# Patient Record
Sex: Male | Born: 1975 | Hispanic: Yes | Marital: Married | Smoking: Former smoker
Health system: Southern US, Community
[De-identification: ages and names within clinical notes are randomized; demographics above are authoritative.]

## PROBLEM LIST (undated history)

## (undated) DIAGNOSIS — I272 Pulmonary hypertension, unspecified: Secondary | ICD-10-CM

## (undated) DIAGNOSIS — E119 Type 2 diabetes mellitus without complications: Secondary | ICD-10-CM

## (undated) DIAGNOSIS — Z86718 Personal history of other venous thrombosis and embolism: Secondary | ICD-10-CM

## (undated) DIAGNOSIS — Z86711 Personal history of pulmonary embolism: Secondary | ICD-10-CM

## (undated) DIAGNOSIS — F4310 Post-traumatic stress disorder, unspecified: Secondary | ICD-10-CM

## (undated) DIAGNOSIS — F419 Anxiety disorder, unspecified: Secondary | ICD-10-CM

## (undated) DIAGNOSIS — R112 Nausea with vomiting, unspecified: Secondary | ICD-10-CM

## (undated) DIAGNOSIS — Z8659 Personal history of other mental and behavioral disorders: Secondary | ICD-10-CM

## (undated) DIAGNOSIS — I2699 Other pulmonary embolism without acute cor pulmonale: Secondary | ICD-10-CM

## (undated) DIAGNOSIS — G4733 Obstructive sleep apnea (adult) (pediatric): Secondary | ICD-10-CM

## (undated) DIAGNOSIS — G8929 Other chronic pain: Secondary | ICD-10-CM

## (undated) DIAGNOSIS — Z8674 Personal history of sudden cardiac arrest: Secondary | ICD-10-CM

## (undated) DIAGNOSIS — S43432A Superior glenoid labrum lesion of left shoulder, initial encounter: Secondary | ICD-10-CM

## (undated) DIAGNOSIS — M25512 Pain in left shoulder: Secondary | ICD-10-CM

## (undated) DIAGNOSIS — Z95810 Presence of automatic (implantable) cardiac defibrillator: Secondary | ICD-10-CM

## (undated) DIAGNOSIS — I10 Essential (primary) hypertension: Secondary | ICD-10-CM

## (undated) DIAGNOSIS — Z9889 Other specified postprocedural states: Secondary | ICD-10-CM

## (undated) DIAGNOSIS — G4730 Sleep apnea, unspecified: Secondary | ICD-10-CM

## (undated) DIAGNOSIS — F411 Generalized anxiety disorder: Secondary | ICD-10-CM

## (undated) HISTORY — DX: Other pulmonary embolism without acute cor pulmonale: I26.99

## (undated) HISTORY — DX: Essential (primary) hypertension: I10

## (undated) HISTORY — PX: EP IMPLANTABLE DEVICE: SHX172B

## (undated) HISTORY — DX: Pulmonary hypertension, unspecified: I27.20

## (undated) HISTORY — PX: OTHER SURGICAL HISTORY: SHX169

## (undated) HISTORY — PX: KNEE SURGERY: SHX244

## (undated) HISTORY — PX: CARDIAC CATHETERIZATION: SHX172

## (undated) HISTORY — PX: ANTERIOR CRUCIATE LIGAMENT REPAIR: SHX115

---
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
## 2013-02-20 DIAGNOSIS — E119 Type 2 diabetes mellitus without complications: Secondary | ICD-10-CM | POA: Insufficient documentation

## 2013-02-20 DIAGNOSIS — E118 Type 2 diabetes mellitus with unspecified complications: Secondary | ICD-10-CM | POA: Insufficient documentation

## 2013-02-20 DIAGNOSIS — I10 Essential (primary) hypertension: Secondary | ICD-10-CM | POA: Insufficient documentation

## 2014-07-11 DIAGNOSIS — S8990XA Unspecified injury of unspecified lower leg, initial encounter: Secondary | ICD-10-CM | POA: Insufficient documentation

## 2014-07-14 DIAGNOSIS — G8918 Other acute postprocedural pain: Secondary | ICD-10-CM | POA: Insufficient documentation

## 2014-07-14 HISTORY — PX: OTHER SURGICAL HISTORY: SHX169

## 2014-08-02 DIAGNOSIS — E1169 Type 2 diabetes mellitus with other specified complication: Secondary | ICD-10-CM | POA: Insufficient documentation

## 2014-08-02 DIAGNOSIS — I2699 Other pulmonary embolism without acute cor pulmonale: Secondary | ICD-10-CM | POA: Insufficient documentation

## 2014-08-02 DIAGNOSIS — F17210 Nicotine dependence, cigarettes, uncomplicated: Secondary | ICD-10-CM | POA: Insufficient documentation

## 2014-08-02 DIAGNOSIS — E782 Mixed hyperlipidemia: Secondary | ICD-10-CM | POA: Insufficient documentation

## 2014-08-04 DIAGNOSIS — R748 Abnormal levels of other serum enzymes: Secondary | ICD-10-CM | POA: Insufficient documentation

## 2014-08-27 DIAGNOSIS — I272 Pulmonary hypertension, unspecified: Secondary | ICD-10-CM | POA: Insufficient documentation

## 2015-09-14 ENCOUNTER — Emergency Department (INDEPENDENT_AMBULATORY_CARE_PROVIDER_SITE_OTHER): Payer: BLUE CROSS/BLUE SHIELD

## 2015-09-14 ENCOUNTER — Emergency Department
Admission: EM | Admit: 2015-09-14 | Discharge: 2015-09-14 | Disposition: A | Discharge status: Home / Self Care | Payer: BLUE CROSS/BLUE SHIELD | Source: Home / Self Care | Attending: Family Medicine | Admitting: Family Medicine

## 2015-09-14 ENCOUNTER — Encounter: Payer: Self-pay | Admitting: *Deleted

## 2015-09-14 DIAGNOSIS — R42 Dizziness and giddiness: Secondary | ICD-10-CM

## 2015-09-14 DIAGNOSIS — I10 Essential (primary) hypertension: Secondary | ICD-10-CM

## 2015-09-14 DIAGNOSIS — D72829 Elevated white blood cell count, unspecified: Secondary | ICD-10-CM | POA: Diagnosis not present

## 2015-09-14 DIAGNOSIS — M25562 Pain in left knee: Secondary | ICD-10-CM

## 2015-09-14 DIAGNOSIS — E1365 Other specified diabetes mellitus with hyperglycemia: Secondary | ICD-10-CM

## 2015-09-14 DIAGNOSIS — IMO0002 Reserved for concepts with insufficient information to code with codable children: Secondary | ICD-10-CM

## 2015-09-14 DIAGNOSIS — Z86711 Personal history of pulmonary embolism: Secondary | ICD-10-CM

## 2015-09-14 DIAGNOSIS — G8929 Other chronic pain: Secondary | ICD-10-CM

## 2015-09-14 DIAGNOSIS — E1343 Other specified diabetes mellitus with diabetic autonomic (poly)neuropathy: Secondary | ICD-10-CM | POA: Diagnosis not present

## 2015-09-14 HISTORY — DX: Type 2 diabetes mellitus without complications: E11.9

## 2015-09-14 LAB — POCT URINALYSIS DIPSTICK
Blood, UA: NEGATIVE
GLUCOSE UA: 500
LEUKOCYTES UA: NEGATIVE
NITRITE UA: NEGATIVE
Protein, UA: 30
Spec Grav, UA: >=1.03 (ref 1.005–1.03)
UROBILINOGEN UA: 0.2 (ref 0–1)
pH, UA: 5 (ref 5–8)

## 2015-09-14 LAB — POCT CBC W AUTO DIFF (K'VILLE URGENT CARE)

## 2015-09-14 LAB — SEDIMENTATION RATE: SED RATE: 5 mm/h (ref 0–15)

## 2015-09-14 LAB — POCT FASTING CBG KUC MANUAL ENTRY: POCT Glucose (KUC): 305 mg/dL — AB (ref 70–99)

## 2015-09-14 LAB — D-DIMER, QUANTITATIVE: D-Dimer, Quant: 0.19 mcg/mL FEU (ref <0.50)

## 2015-09-14 MED ORDER — LISINOPRIL 5 MG PO TABS: 5.0000 mg | ORAL_TABLET | Freq: Every day | ORAL | 0 refills | Status: DC

## 2015-09-14 NOTE — Discharge Instructions (Signed)
Continue Metformin as prescribed.  Recommend checking blood pressure regularly and record on a calendar.  Also check blood glucose several times weekly and record

## 2015-09-14 NOTE — ED Provider Notes (Signed)
[facility]    CSN: [id number] Arrival date & time: 09/14/15  1115  First Provider Contact:  None       History   Chief Complaint Chief Complaint  Patient presents with  . Dizziness    HPI Russell Ramos is a 40 y.o. male.   Patient complains of approximately 3 month history of initially intermittent vague sensation of light-headedness/dizziness that would last 15 to 30 seconds.  He does not feel off balance or have difficulty walking.  Recently the symptoms have become more frequent, and during the past several days have been almost constant. He states that he had repair of a damaged left knee LCL in May 2016, suffering a post-op pulmonary embolus.  He recalls that at that time he had similar symptoms with the addition of severe chest tightness and dyspnea.  At this time he has not experienced any chest symptoms.  No lower leg swelling or pain.  He states that he still has chronic discomfort in his left lateral knee, although improved since his knee surgery. He has a history of hypertension but admits that he has not taken his lisinopril for about a month.  He also has a history of type 2 diabetes that was originally controlled on metformin 1000mg  BID.  He admits that he does not monitor his glucose, and has gained weight over the past year.  His last Hgb A1c was 9+ (he does not remember the date).  He states that he does not like metformin because it causes loose stools.   The history is provided by the patient and the spouse.    Past Medical History:  Diagnosis Date  . Diabetes mellitus without complication (HCC)   Pulmonary embolus  Current active problems:  Hypertension and diabetes   Past Surgical History:  Procedure Laterality Date  . KNEE SURGERY Left        Home Medications    Prior to Admission medications   Medication Sig Start Date End Date Taking? Authorizing Provider  atorvastatin (LIPITOR) 40 MG tablet Take 40 mg by mouth daily.   Yes  Historical Provider, MD  metFORMIN (GLUCOPHAGE) 500 MG tablet Take by mouth 2 (two) times daily with a meal.   Yes Historical Provider, MD  lisinopril (PRINIVIL,ZESTRIL) 5 MG tablet Take 1 tablet (5 mg total) by mouth daily. 09/14/15   Lattie Haw, MD    Family History Family History  Problem Relation Age of Onset  . Diabetes Mother     Social History Social History  Substance Use Topics  . Smoking status: Current Every Day Smoker    Packs/day: 1.00    Types: Cigarettes  . Smokeless tobacco: Never Used  . Alcohol use Yes     Comment: socially     Allergies   Review of patient's allergies indicates no known allergies.   Review of Systems Review of Systems  Constitutional: Positive for activity change and fatigue. Negative for appetite change, chills, diaphoresis, fever and unexpected weight change.  HENT: Negative.   Eyes: Negative.   Respiratory: Negative.   Cardiovascular: Negative for chest pain, palpitations and leg swelling.  Gastrointestinal: Negative.   Endocrine: Positive for polyuria.  Genitourinary: Negative.   Musculoskeletal:       Left knee pain  Skin: Negative.   Neurological: Positive for dizziness and light-headedness. Negative for tremors, syncope, facial asymmetry, speech difficulty, weakness, numbness and headaches.  Hematological: Negative.   Psychiatric/Behavioral: Negative.      Physical Exam Triage Vital  Signs ED Triage Vitals  Enc Vitals Group     BP 09/14/15 1206 138/98     Pulse Rate 09/14/15 1206 63     Resp 09/14/15 1206 18     Temp 09/14/15 1206 98 F (36.7 C)     Temp Source 09/14/15 1206 Oral     SpO2 09/14/15 1206 98 %     Weight 09/14/15 1206 288 lb (130.6 kg)     Height 09/14/15 1206  (1.753 m)     Head Circumference --      Peak Flow --      Pain Score 09/14/15 1209 0     Pain Loc --      Pain Edu? --      Excl. in GC? --    Orthostatic VS for the past 24 hrs:  BP- Lying Pulse- Lying BP- Sitting Pulse-  Sitting BP- Standing at 0 minutes Pulse- Standing at 0 minutes  09/14/15 1307 129/84 64 126/84 65 125/83 65    Updated Vital Signs BP 138/98 (BP Location: Left Arm)   Pulse 63   Temp 98 F (36.7 C) (Oral)   Resp 18   Ht  (1.753 m)   Wt 288 lb (130.6 kg)   SpO2 98%   BMI 42.53 kg/m       Physical Exam Nursing notes and Vital Signs reviewed. Appearance:  Patient appears stated age, and in no acute distress.  Patient is obese (BMI 42.5).  He is alert and oriented.  Eyes:  Pupils are equal, round, and reactive to light and accomodation.  Extraocular movement is intact.  Conjunctivae are not inflamed.  Fundi benign.  No nystagmus Ears:  Canals normal.  Tympanic membranes normal.  Nose:  Normal turbinates.  No sinus tenderness.    Pharynx:  Normal Neck:  Supple.  No adenopathy or thyromegaly.  Carotids have normal upstrokes Lungs:  Clear to auscultation.  Breath sounds are equal.  Moving air well. Heart:  Regular rate and rhythm without murmurs, rubs, or gallops.  Abdomen:  Nontender without masses or hepatosplenomegaly.  Bowel sounds are present.  No CVA or flank tenderness.  Extremities:  No edema.  Left Knee:  Decreased range of motion to full flexion.  No swelling, erythema, warmth or effusion.  Lateral surgical scar present Skin:  No rash present.  Neurologic:  Cranial nerves 2 through 12 are normal.  Patellar, achilles, and elbow reflexes are normal.  Cerebellar function is intact (finger-to-nose and rapid alternating hand movement).  Gait and station are normal.      UC Treatments / Results  Labs (all labs ordered are listed, but only abnormal results are displayed) Labs Reviewed  HEMOGLOBIN A1C - Abnormal; Notable for the following:       Result Value   Hgb A1c MFr Bld 10.5 (*)    All other components within normal limits   Narrative:    Performed at:  [facility]                [address], [address]                [place], [place] [address]  POCT FASTING  CBG [facility] MANUAL ENTRY - Abnormal; Notable for the following:    POCT Glucose ([facility]) 305 (*)    All other components within normal limits  D-DIMER, QUANTITATIVE (NOT AT [facility])   Narrative:    Performed at:  [facility]  [address]                [place], [place] [address]  SEDIMENTATION RATE   Narrative:    Performed at:  [facility]                [address], [address]                [place], [place] [address]  POCT CBC W AUTO DIFF ([facility]):  Note elevated WBC 11.6  POCT URINALYSIS DIPSTICK:  Note GLU 500mg /dL, small Bili; trace KET; PRO 30mg /dL    EKG  EKG Interpretation None       Radiology Dg Chest 2 View  Result Date: 09/14/2015 CLINICAL DATA:  Lightheadedness for a few days. EXAM: CHEST  2 VIEW COMPARISON:  None. FINDINGS: The heart size and mediastinal contours are within normal limits. Both lungs are clear. The visualized skeletal structures are unremarkable. IMPRESSION: No active cardiopulmonary disease. Electronically Signed   By: Kennith Center M.D.   On: 09/14/2015 13:20   Procedures Procedures (including critical care time)  Medications Ordered in UC Medications - No data to display   Initial Impression / Assessment and Plan / UC Course  I have reviewed the triage vital signs and the nursing notes.  Pertinent labs & imaging results that were available during my care of the patient were reviewed by me and considered in my medical decision making (see chart for details).  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  Negative chest X-ray reassuring; with past history of PE,will obtain D-dimer.  No exam evidence of  chronic left knee inflammation.  Patient's vague sensation of intermittent light-headedness and dizziness probably a manifestation of uncontrolled type 2 diabetes.  Note minimal variation in orthostatic vital signs. Resume taking lisinopril (new Rx written) Continue Metformin as prescribed.  Recommend checking blood pressure regularly and record on a calendar.  Also check blood glucose several times weekly and record Will obtain CMP, Sed Rate, and Hgb A1c. Followup with Dr. Denyse Amass in two days to establish care.  New Prescriptions Discharge Medication List as of 09/14/2015  2:06 PM       Lattie Haw, MD 09/15/15 1153

## 2015-09-14 NOTE — ED Triage Notes (Signed)
Pt c/o dizziness, light headed, and shaky episodes intermittently x 1 mth, worse x 2 days. He reports similar episodes after having LT knee sx on 07/14/14 and he had blood clots in his leg that moved to his lungs. He reports being out of Lisinopril for 1 month. He has not seen an MD for his diabetes "in a while".

## 2015-09-15 ENCOUNTER — Telehealth: Payer: Self-pay | Admitting: *Deleted

## 2015-09-15 LAB — HEMOGLOBIN A1C
Hgb A1c MFr Bld: 10.5 % — ABNORMAL HIGH (ref <5.7)
Mean Plasma Glucose: 255 mg/dL

## 2015-09-15 NOTE — Telephone Encounter (Signed)
Callback: Lab results given and discussed. He reports he is still feeling shaky, lightheaded and now anxious about how he has been feeling. Advised it is possibly caused by resuming BP medication, BP and abnormal glucose as well as anxiety triggered by his symptoms. Encouraged to keep apt with Dr. Denyse Amass tomorrow, if he feels necessary or drastic changes in symptoms call EMS ort go to ER. Encouraged to hydrate, take Rxs as prescribed and eat well, try to rest. Call back as needed.

## 2015-09-16 ENCOUNTER — Encounter: Payer: Self-pay | Admitting: Family Medicine

## 2015-09-16 ENCOUNTER — Ambulatory Visit (INDEPENDENT_AMBULATORY_CARE_PROVIDER_SITE_OTHER): Payer: BLUE CROSS/BLUE SHIELD | Admitting: Family Medicine

## 2015-09-16 VITALS — BP 127/78 | HR 67 | Ht 69.0 in | Wt 289.0 lb

## 2015-09-16 DIAGNOSIS — Z23 Encounter for immunization: Secondary | ICD-10-CM

## 2015-09-16 DIAGNOSIS — R5383 Other fatigue: Secondary | ICD-10-CM | POA: Insufficient documentation

## 2015-09-16 DIAGNOSIS — E1169 Type 2 diabetes mellitus with other specified complication: Secondary | ICD-10-CM

## 2015-09-16 DIAGNOSIS — R5382 Chronic fatigue, unspecified: Secondary | ICD-10-CM

## 2015-09-16 DIAGNOSIS — I272 Other secondary pulmonary hypertension: Secondary | ICD-10-CM

## 2015-09-16 DIAGNOSIS — E782 Mixed hyperlipidemia: Secondary | ICD-10-CM

## 2015-09-16 DIAGNOSIS — R7989 Other specified abnormal findings of blood chemistry: Secondary | ICD-10-CM

## 2015-09-16 DIAGNOSIS — Z114 Encounter for screening for human immunodeficiency virus [HIV]: Secondary | ICD-10-CM

## 2015-09-16 DIAGNOSIS — I10 Essential (primary) hypertension: Secondary | ICD-10-CM | POA: Diagnosis not present

## 2015-09-16 DIAGNOSIS — G4733 Obstructive sleep apnea (adult) (pediatric): Secondary | ICD-10-CM | POA: Insufficient documentation

## 2015-09-16 DIAGNOSIS — E1165 Type 2 diabetes mellitus with hyperglycemia: Secondary | ICD-10-CM | POA: Diagnosis not present

## 2015-09-16 DIAGNOSIS — R0683 Snoring: Secondary | ICD-10-CM

## 2015-09-16 DIAGNOSIS — R748 Abnormal levels of other serum enzymes: Secondary | ICD-10-CM

## 2015-09-16 DIAGNOSIS — T383X5A Adverse effect of insulin and oral hypoglycemic [antidiabetic] drugs, initial encounter: Secondary | ICD-10-CM | POA: Diagnosis not present

## 2015-09-16 DIAGNOSIS — E559 Vitamin D deficiency, unspecified: Secondary | ICD-10-CM

## 2015-09-16 MED ORDER — AMBULATORY NON FORMULARY MEDICATION: 0 refills | Status: DC

## 2015-09-16 MED ORDER — ATORVASTATIN CALCIUM 40 MG PO TABS: 40.0000 mg | ORAL_TABLET | Freq: Every day | ORAL | 1 refills | Status: DC

## 2015-09-16 MED ORDER — LISINOPRIL 5 MG PO TABS: 5.0000 mg | ORAL_TABLET | Freq: Every day | ORAL | 1 refills | Status: DC

## 2015-09-16 MED ORDER — DULAGLUTIDE 1.5 MG/0.5ML ~~LOC~~ SOAJ: 1.5000 mg | SUBCUTANEOUS | 12 refills | Status: DC

## 2015-09-16 NOTE — Patient Instructions (Signed)
Thank you for coming in today. Start Trulicity.  Continue medicines.  Return in 1 month.  Check fasting morning labs in 2 weeks.  Return sooner if needed.   Dulaglutide injection What is this medicine? DULAGLUTIDE (DOO la GLOO tide) is used to improve blood sugar control in adults with type 2 diabetes. This medicine may be used with other oral diabetes medicines. This medicine may be used for other purposes; ask your health care provider or pharmacist if you have questions. What should I tell my health care provider before I take this medicine? They need to know if you have any of these conditions: -endocrine tumors (MEN 2) or if someone in your family had these tumors -history of pancreatitis -kidney disease -liver disease -stomach problems -thyroid cancer or if someone in your family had thyroid cancer -an unusual or allergic reaction to dulaglutide, other medicines, foods, dyes, or preservatives -pregnant or trying to get pregnant -breast-feeding How should I use this medicine? This medicine is for injection under the skin of your upper leg (thigh), stomach area, or upper arm. It is usually given once every week (every 7 days). You will be taught how to prepare and give this medicine. Use exactly as directed. Take your medicine at regular intervals. Do not take it more often than directed. If you use this medicine with insulin, you should inject this medicine and the insulin separately. Do not mix them together. Do not give the injections right next to each other. Change (rotate) injection sites with each injection. It is important that you put your used needles and syringes in a special sharps container. Do not put them in a trash can. If you do not have a sharps container, call your pharmacist or healthcare provider to get one. A special MedGuide will be given to you by the pharmacist with each prescription and refill. Be sure to read this information carefully each time. Talk to your  pediatrician regarding the use of this medicine in children. Special care may be needed. Overdosage: If you think you have taken too much of this medicine contact a poison control center or emergency room at once. NOTE: This medicine is only for you. Do not share this medicine with others. What if I miss a dose? If you miss a dose, take it as soon as you can within 3 days after the missed dose. Then take your next dose at your regular weekly time. If it has been longer than 3 days after the missed dose, do not take the missed dose. Take the next dose at your regular time. Do not take double or extra doses. If you have questions about a missed dose, contact your health care provider for advice. What may interact with this medicine? Do not take this medicine with any of the following medications: -gatifloxacin Many medications may cause changes in blood sugar, these include: -alcohol containing beverages -aspirin and aspirin-like drugs -chloramphenicol -chromium -diuretics -male hormones, such as estrogens or progestins, birth control pills -heart medicines -isoniazid -male hormones or anabolic steroids -medications for weight loss -medicines for allergies, asthma, cold, or cough -medicines for mental problems -medicines called MAO inhibitors - Nardil, Parnate, Marplan, Eldepryl -niacin -NSAIDS, such as ibuprofen -pentamidine -phenytoin -probenecid -quinolone antibiotics such as ciprofloxacin, levofloxacin, ofloxacin -some herbal dietary supplements -steroid medicines such as prednisone or cortisone -thyroid hormonesSome medications can hide the warning symptoms of low blood sugar (hypoglycemia). You may need to monitor your blood sugar more closely if you are taking one of  these medications. These include: -beta-blockers, often used for high blood pressure or heart problems (examples include atenolol, metoprolol, propranolol) -clonidine -guanethidine -reserpine This list may not  describe all possible interactions. Give your health care provider a list of all the medicines, herbs, non-prescription drugs, or dietary supplements you use. Also tell them if you smoke, drink alcohol, or use illegal drugs. Some items may interact with your medicine. What should I watch for while using this medicine? Visit your doctor or health care professional for regular checks on your progress. A test called the HbA1C (A1C) will be monitored. This is a simple blood test. It measures your blood sugar control over the last 2 to 3 months. You will receive this test every 3 to 6 months. Learn how to check your blood sugar. Learn the symptoms of low and high blood sugar and how to manage them. Always carry a quick-source of sugar with you in case you have symptoms of low blood sugar. Examples include hard sugar candy or glucose tablets. Make sure others know that you can choke if you eat or drink when you develop serious symptoms of low blood sugar, such as seizures or unconsciousness. They must get medical help at once. Tell your doctor or health care professional if you have high blood sugar. You might need to change the dose of your medicine. If you are sick or exercising more than usual, you might need to change the dose of your medicine. Do not skip meals. Ask your doctor or health care professional if you should avoid alcohol. Many nonprescription cough and cold products contain sugar or alcohol. These can affect blood sugar. Wear a medical ID bracelet or chain, and carry a card that describes your disease and details of your medicine and dosage times. What side effects may I notice from receiving this medicine? Side effects that you should report to your doctor or health care professional as soon as possible: -allergic reactions like skin rash, itching or hives, swelling of the face, lips, or tongue -breathing problems -signs and symptoms of low blood sugar such as feeling anxious, confusion,  dizziness, increased hunger, unusually weak or tired, sweating, shakiness, cold, irritable, headache, blurred vision, fast heartbeat, loss of consciousness -unusual stomach upset or pain -vomiting Side effects that usually do not require medical attention (Report these to your doctor or health care professional if they continue or are bothersome.):diarrhea -heartburn -loss of appetite -nausea -pain, redness, or irritation at site where injected This list may not describe all possible side effects. Call your doctor for medical advice about side effects. You may report side effects to FDA at 1-800-FDA-1088. Where should I keep my medicine? Keep out of the reach of children. Store this medicine in a refrigerator between 2 and 8 degrees C (36 and 46 degrees F). Do not freeze or use if the medicine has been frozen. Protect from light and excessive heat. Each single-dose pen or prefilled syringe can be kept at room temperature, not to exceed 30 degrees C (86 degrees F) for a total of 14 days, if needed. Store in the carton until use. Throw away any unused medicine after the expiration date. NOTE: This sheet is a summary. It may not cover all possible information. If you have questions about this medicine, talk to your doctor, pharmacist, or health care provider.    2016, Elsevier/Gold Standard. (2012-12-09 13:53:28)

## 2015-09-16 NOTE — Progress Notes (Signed)
Russell Ramos is a 40 y.o. male who presents to [name] today for establish care and discuss diabetes, right heart pulmonary hypertension, hyperlipidemia, hypertension, sleep apnea.  Patient has an extensive past medical history significant for morbid obesity with poorly controlled diabetes. He had a left knee surgery and suffered a post surgical pulmonary embolism that resulted in right heart pulmonary hypertension and strain. He was lost to follow-up then in the interval notes that he's been feeling poorly with some lightheadedness. He was seen in urgent care recently diagnosed with uncontrolled hypertension with an A1c around 10 and started on metformin lisinopril and atorvastatin and asked to follow-up today. He notes in the past he's had trouble tolerating metformin as a cause diarrhea. He notes with the start of the new metformin he continues to have bothersome diarrhea. Otherwise he feels better with no chest pains palpitations or shortness of breath.  He notes that he does snore and has been seen to stop breathing at sleep feels fatigued in the morning and has a history of hypertension. His wife is concerned that he may have sleep apnea.   As for his obesity Mr. Norgard notes that he has actually been losing weight. He has lost over 100 pounds since his max weight of over 400 pounds a few years ago. He is very interested in improving his overall health.   Past Medical History:  Diagnosis Date  . Diabetes mellitus without complication (HCC)   . Hypertension   . Pulmonary embolism (HCC)   . Pulmonary hypertension (HCC)    Past Surgical History:  Procedure Laterality Date  . KNEE SURGERY Left    Social History  Substance Use Topics  . Smoking status: Current Every Day Smoker    Packs/day: 1.00    Types: Cigarettes  . Smokeless tobacco: Never Used  .  Alcohol use Yes     Comment: socially   family history includes Diabetes in his mother.  ROS as above: No headache, visual changes, nausea, vomiting, diarrhea, constipation, dizziness, abdominal pain, skin rash, fevers, chills, night sweats, weight loss, swollen lymph nodes, body aches, joint swelling, muscle aches, chest pain, shortness of breath, mood changes, visual or auditory hallucinations.    Medications: Current Outpatient Prescriptions  Medication Sig Dispense Refill  . atorvastatin (LIPITOR) 40 MG tablet Take 1 tablet (40 mg total) by mouth daily. 90 tablet 1  . lisinopril (PRINIVIL,ZESTRIL) 5 MG tablet Take 1 tablet (5 mg total) by mouth daily. 90 tablet 1  . AMBULATORY NON FORMULARY MEDICATION Single glucometer with lancets, test strips. Use glucometer of choice Test daily.  Uncontrolled Diabetes E11.9 1 each 0  . Dulaglutide (TRULICITY) 1.5 MG/0.5ML SOPN Inject 1.5 mg into the skin once a week. 4 pen 12   No current facility-administered medications for this visit.    No Known Allergies   Exam:  BP 127/78   Pulse 67   Ht 5\' 9"  (1.753 m)   Wt 289 lb (131.1 kg)   BMI 42.68 kg/m  Gen: Well NAD Morbidly obese  HEENT: EOMI,  MMM Lungs: Normal work of breathing. CTABL Heart: RRR no MRG Abd: NABS, Soft. Nondistended, Nontender Exts: Brisk capillary refill, warm and well perfused.   Diabetic foot exam significant for poor sensation left lateral foot otherwise normal. Please see flow sheet for further details.  STOP BANG: Snore:     Yes Tired:     Yes Observed stop breathing:  Yes Hypertension:   Yes  BMI >35:   Yes Age >50:   No Neck > 16 inches:  Yes Male gender:   Yes ------------------------------------------ Total:     7/8   Lab Results  Component Value Date   HGBA1C 10.5 (H) 09/14/2015     No results found for this or any previous visit (from the past 24 hour(s)). Dg Chest 2 View  Result Date: 09/14/2015 CLINICAL DATA:  Lightheadedness for a  few days. EXAM: CHEST  2 VIEW COMPARISON:  None. FINDINGS: The heart size and mediastinal contours are within normal limits. Both lungs are clear. The visualized skeletal structures are unremarkable. IMPRESSION: No active cardiopulmonary disease. Electronically Signed   By: Kennith Center M.D.   On: 09/14/2015 13:20     Assessment and Plan: 40 y.o. male with   1) diabetes: Poorly controlled with an A1c of 10.5. Patient is intolerant to metformin. Discussed several options. We'll start Trulicity today. Recheck in about a month.  2) pulmonary hypertension: Not well controlled. Continue lisinopril. Treat probable sleep apnea discussed below.  3) snoring/probable sleep apnea: Patient has significantly positive stop bang score. We'll obtain sleep study.  4) hypertension: Well-controlled. Continue lisinopril. Check fasting labs in about 2 weeks.  5) lipids: Continue atorvastatin due to overall risk factors. Check lipids in a few weeks.  6) vaccines: Tdap and Pneumovax given today.   Orders Placed This Encounter  Procedures  . Tdap vaccine greater than or equal to 7yo IM  . Pneumococcal polysaccharide vaccine 23-valent greater than or equal to 2yo subcutaneous/IM  . CBC  . Comprehensive metabolic panel    Order Specific Question:   Has the patient fasted?    Answer:   No  . Vitamin B12  . Folate  . HIV antibody  . Lipid panel    Order Specific Question:   Has the patient fasted?    Answer:   No  . TSH  . Uric acid  . VITAMIN D 25 Hydroxy (Vit-D Deficiency, Fractures)  . Testosterone  . Home sleep test    Scheduling Instructions:     [facility]    Order Specific Question:   Where should this test be performed:    Answer:   Other    Discussed warning signs or symptoms. Please see discharge instructions. Patient expresses understanding.

## 2015-09-23 DIAGNOSIS — R7989 Other specified abnormal findings of blood chemistry: Secondary | ICD-10-CM | POA: Insufficient documentation

## 2015-09-23 DIAGNOSIS — E559 Vitamin D deficiency, unspecified: Secondary | ICD-10-CM | POA: Insufficient documentation

## 2015-09-23 LAB — CBC
HEMATOCRIT: 47.3 % (ref 38.5–50.0)
Hemoglobin: 16.1 g/dL (ref 13.2–17.1)
MCH: 31.6 pg (ref 27.0–33.0)
MCHC: 34 g/dL (ref 32.0–36.0)
MCV: 92.9 fL (ref 80.0–100.0)
MPV: 9.1 fL (ref 7.5–12.5)
Platelets: 373 10*3/uL (ref 140–400)
RBC: 5.09 MIL/uL (ref 4.20–5.80)
RDW: 12.5 % (ref 11.0–15.0)
WBC: 8.7 10*3/uL (ref 3.8–10.8)

## 2015-09-23 LAB — COMPREHENSIVE METABOLIC PANEL
ALK PHOS: 75 U/L (ref 40–115)
ALT: 27 U/L (ref 9–46)
AST: 20 U/L (ref 10–40)
Albumin: 4.2 g/dL (ref 3.6–5.1)
BILIRUBIN TOTAL: 0.7 mg/dL (ref 0.2–1.2)
BUN: 18 mg/dL (ref 7–25)
CALCIUM: 10.1 mg/dL (ref 8.6–10.3)
CO2: 23 mmol/L (ref 20–31)
CREATININE: 1.07 mg/dL (ref 0.60–1.35)
Chloride: 100 mmol/L (ref 98–110)
GLUCOSE: 164 mg/dL — AB (ref 65–99)
Potassium: 4.8 mmol/L (ref 3.5–5.3)
SODIUM: 135 mmol/L (ref 135–146)
Total Protein: 7.9 g/dL (ref 6.1–8.1)

## 2015-09-23 LAB — TESTOSTERONE: Testosterone: 206 ng/dL — ABNORMAL LOW (ref 250–827)

## 2015-09-23 LAB — HIV ANTIBODY (ROUTINE TESTING W REFLEX): HIV: NONREACTIVE

## 2015-09-23 LAB — FOLATE: Folate: 10.2 ng/mL (ref >5.4)

## 2015-09-23 LAB — LIPID PANEL
CHOL/HDL RATIO: 7 ratio — AB (ref <=5.0)
Cholesterol: 174 mg/dL (ref 125–200)
HDL: 25 mg/dL — AB (ref >=40)
LDL CALC: 80 mg/dL (ref <130)
Triglycerides: 347 mg/dL — ABNORMAL HIGH (ref <150)
VLDL: 69 mg/dL — AB (ref <30)

## 2015-09-23 LAB — VITAMIN D 25 HYDROXY (VIT D DEFICIENCY, FRACTURES): Vit D, 25-Hydroxy: 10 ng/mL — ABNORMAL LOW (ref 30–100)

## 2015-09-23 LAB — TSH: TSH: 2.6 m[IU]/L (ref 0.40–4.50)

## 2015-09-23 LAB — VITAMIN B12: Vitamin B-12: 514 pg/mL (ref 200–1100)

## 2015-09-23 LAB — URIC ACID: URIC ACID, SERUM: 7.7 mg/dL (ref 4.0–8.0)

## 2015-09-23 NOTE — Addendum Note (Signed)
Addended by: Rodolph Bong on: 09/23/2015 07:32 AM   Modules accepted: Orders

## 2015-09-29 ENCOUNTER — Ambulatory Visit (INDEPENDENT_AMBULATORY_CARE_PROVIDER_SITE_OTHER): Payer: BLUE CROSS/BLUE SHIELD | Admitting: Family Medicine

## 2015-09-29 ENCOUNTER — Encounter: Payer: Self-pay | Admitting: Family Medicine

## 2015-09-29 VITALS — BP 110/74 | HR 72 | Wt 287.0 lb

## 2015-09-29 DIAGNOSIS — I272 Other secondary pulmonary hypertension: Secondary | ICD-10-CM

## 2015-09-29 DIAGNOSIS — R002 Palpitations: Secondary | ICD-10-CM

## 2015-09-29 NOTE — Patient Instructions (Signed)
Thank you for coming in today. You should hear form cardiology soon about both the appointment and the holter monitor.  Return in 1 month otherwise.  Call or go to the emergency room if you get worse, have trouble breathing, have chest pains, or palpitations.    Palpitations A palpitation is the feeling that your heartbeat is irregular or is faster than normal. It may feel like your heart is fluttering or skipping a beat. Palpitations are usually not a serious problem. However, in some cases, you may need further medical evaluation. CAUSES  Palpitations can be caused by:  Smoking.  Caffeine or other stimulants, such as diet pills or energy drinks.  Alcohol.  Stress and anxiety.  Strenuous physical activity.  Fatigue.  Certain medicines.  Heart disease, especially if you have a history of irregular heart rhythms (arrhythmias), such as atrial fibrillation, atrial flutter, or supraventricular tachycardia.  An improperly working pacemaker or defibrillator. DIAGNOSIS  To find the cause of your palpitations, your health care provider will take your medical history and perform a physical exam. Your health care provider may also have you take a test called an ambulatory electrocardiogram (ECG). An ECG records your heartbeat patterns over a 24-hour period. You may also have other tests, such as:  Transthoracic echocardiogram (TTE). During echocardiography, sound waves are used to evaluate how blood flows through your heart.  Transesophageal echocardiogram (TEE).  Cardiac monitoring. This allows your health care provider to monitor your heart rate and rhythm in real time.  Holter monitor. This is a portable device that records your heartbeat and can help diagnose heart arrhythmias. It allows your health care provider to track your heart activity for several days, if needed.  Stress tests by exercise or by giving medicine that makes the heart beat faster. TREATMENT  Treatment of  palpitations depends on the cause of your symptoms and can vary greatly. Most cases of palpitations do not require any treatment other than time, relaxation, and monitoring your symptoms. Other causes, such as atrial fibrillation, atrial flutter, or supraventricular tachycardia, usually require further treatment. HOME CARE INSTRUCTIONS   Avoid:  Caffeinated coffee, tea, soft drinks, diet pills, and energy drinks.  Chocolate.  Alcohol.  Stop smoking if you smoke.  Reduce your stress and anxiety. Things that can help you relax include:  A method of controlling things in your body, such as your heartbeats, with your mind (biofeedback).  Yoga.  Meditation.  Physical activity such as swimming, jogging, or walking.  Get plenty of rest and sleep. SEEK MEDICAL CARE IF:   You continue to have a fast or irregular heartbeat beyond 24 hours.  Your palpitations occur more often. SEEK IMMEDIATE MEDICAL CARE IF:  You have chest pain or shortness of breath.  You have a severe headache.  You feel dizzy or you faint. MAKE SURE YOU:  Understand these instructions.  Will watch your condition.  Will get help right away if you are not doing well or get worse.   This information is not intended to replace advice given to you by your health care provider. Make sure you discuss any questions you have with your health care provider.   Document Released: 02/03/2000 Document Revised: 02/10/2013 Document Reviewed: 04/06/2011 Elsevier Interactive Patient Education [employer]2016 Elsevier Inc.

## 2015-09-29 NOTE — Progress Notes (Signed)
Russell Ramos is a 40 y.o. male who [facility] today for evaluation of palpitations. Patient reports frequent episodes occurring for the past few months.  Over the past few weeks Russell Ramos has had multiple episodes per day.  Russell Ramos claims they feel similar to his pulmonary embolism after knee surgery in 2016 but without the shortness of breath or severe chest pain.  Russell Ramos describes the episodes as "feeling like I'm going to pass out" with mild chest discomfort and palpitations.  Russell Ramos denies dizziness and syncope.  Patient can't think of any inciting factors and claims Russell Ramos even gets them at rest.  Russell Ramos states they last for a couple hours and usually go away.  Sometimes drinking cold water can help alleviate the spells.  Russell Ramos was seen at urgent care and had a full cardiac workup consisting of an EKG, chest xray, and troponin levels which were all unremarkable.  Russell Ramos was given meclizine and told his symptoms were from his uncontrolled diabetes.  Russell Ramos also had a recently negative D dimer with the same symptoms.    Patient quit smoking two weeks ago.  Russell Ramos has quit in the past but has never had issues like this in the past.    With regards to his diabetes, patient reports his morning blood sugars have been around 140 after starting Trulicity.  Denies nausea and diarrhea.     Past Medical History:  Diagnosis Date  . Diabetes mellitus without complication (HCC)   . Hypertension   . Pulmonary embolism (HCC)   . Pulmonary hypertension (HCC)    Past Surgical History:  Procedure Laterality Date  . KNEE SURGERY Left    Social History  Substance Use Topics  . Smoking status: Current Every Day Smoker    Packs/day: 1.00    Types: Cigarettes  . Smokeless tobacco: Never Used  . Alcohol use Yes     Comment: socially   family history includes Diabetes in his mother.  ROS as above:  No  headache, visual changes, nausea, vomiting, diarrhea, constipation, dizziness, abdominal pain, skin rash, fevers, chills, night sweats, weight loss, swollen lymph nodes, body aches, joint swelling, muscle aches, shortness of breath, mood changes, visual or auditory hallucinations.    Medications: Current Outpatient Prescriptions  Medication Sig Dispense Refill  . AMBULATORY NON FORMULARY MEDICATION Single glucometer with lancets, test strips. Use glucometer of choice Test daily.  Uncontrolled Diabetes E11.9 1 each 0  . atorvastatin (LIPITOR) 40 MG tablet Take 1 tablet (40 mg total) by mouth daily. 90 tablet 1  . Dulaglutide (TRULICITY) 1.5 MG/0.5ML SOPN Inject 1.5 mg into the skin once a week. 4 pen 12  . lisinopril (PRINIVIL,ZESTRIL) 5 MG tablet Take 1 tablet (5 mg total) by mouth daily. 90 tablet 1   No current facility-administered medications for this visit.    No Known Allergies   Exam:  BP 110/74   Pulse 72   Wt 287 lb (130.2 kg)   SpO2 93%   BMI 42.38 kg/m     Gen: Well NAD, nontoxic appearing Lungs: Normal work of breathing. CTABL Heart: RRR no MRG  Abd: NABS, Soft. Nondistended, Nontender Exts: Brisk capillary refill, warm and well perfused.   No peripheral edema Psych: normal mood and affect.  GAD 7 : Generalized Anxiety Score 09/29/2015  Nervous, Anxious, on Edge 0  Control/stop worrying 0  Worry too much - different things 0  Trouble relaxing 0  Restless  0  Easily annoyed or irritable 0  Afraid - awful might happen 0  Total GAD 7 Score 0  Anxiety Difficulty Not difficult at all   Depression screen [facility] 2/9 09/29/2015  Decreased Interest 0  Down, Depressed, Hopeless 0  PHQ - 2 Score 0  Altered sleeping 1  Tired, decreased energy 3  Change in appetite 0  Feeling bad or failure about yourself  0  Trouble concentrating 0  Moving slowly or fidgety/restless 0  Suicidal thoughts 0  PHQ-9 Score 4   Lab Results  Component Value Date   DDIMER 0.19  09/14/2015     No results found for this or any previous visit (from the past 24 hour(s)). No results found.  EKG: Normal sinus rhythm without ST abnormalities  Assessment and Plan: 41 y.o. male with episodes of palpitations and pre-syncope.History of pulmonary HTN due to PE about a year ago.   Russell Ramos is nontoxic appearing and had a normal cardiac exam and EKG.  Low level of concern for PE given the lack of tachycardia and recently negative d dimer with the same symptoms.   - Holter monitor and send a referral to cardiology.   - BMP to evaluate for electrolyte abnormalities   No orders of the defined types were placed in this encounter.   Discussed warning signs or symptoms. Please see discharge instructions. Patient expresses understanding.

## 2015-10-03 ENCOUNTER — Encounter: Payer: Self-pay | Admitting: Family Medicine

## 2015-10-07 ENCOUNTER — Telehealth: Payer: Self-pay | Admitting: Family Medicine

## 2015-10-07 ENCOUNTER — Encounter: Payer: Self-pay | Admitting: Family Medicine

## 2015-10-07 DIAGNOSIS — G4733 Obstructive sleep apnea (adult) (pediatric): Secondary | ICD-10-CM

## 2015-10-07 MED ORDER — AMBULATORY NON FORMULARY MEDICATION: 0 refills | Status: DC

## 2015-10-07 NOTE — Telephone Encounter (Signed)
Sleep study shows sleep apnea. CPAP ordered.

## 2015-10-07 NOTE — Telephone Encounter (Signed)
Sent order to [facility].  Faxed [phone]

## 2015-10-12 ENCOUNTER — Ambulatory Visit (INDEPENDENT_AMBULATORY_CARE_PROVIDER_SITE_OTHER): Payer: BLUE CROSS/BLUE SHIELD

## 2015-10-12 DIAGNOSIS — R002 Palpitations: Secondary | ICD-10-CM | POA: Diagnosis not present

## 2015-10-12 DIAGNOSIS — I272 Other secondary pulmonary hypertension: Secondary | ICD-10-CM

## 2015-10-13 ENCOUNTER — Encounter: Payer: Self-pay | Admitting: Internal Medicine

## 2015-10-13 ENCOUNTER — Ambulatory Visit (INDEPENDENT_AMBULATORY_CARE_PROVIDER_SITE_OTHER): Payer: BLUE CROSS/BLUE SHIELD | Admitting: Internal Medicine

## 2015-10-13 VITALS — BP 118/80 | HR 75 | Ht 68.25 in | Wt 288.4 lb

## 2015-10-13 DIAGNOSIS — E785 Hyperlipidemia, unspecified: Secondary | ICD-10-CM | POA: Insufficient documentation

## 2015-10-13 DIAGNOSIS — E1165 Type 2 diabetes mellitus with hyperglycemia: Secondary | ICD-10-CM | POA: Diagnosis not present

## 2015-10-13 DIAGNOSIS — I10 Essential (primary) hypertension: Secondary | ICD-10-CM

## 2015-10-13 NOTE — Patient Instructions (Signed)
Dr. Rennis GoldenHilty said you can go ahead and remove the monitor and return to [address]  Your physician recommends that you schedule a follow-up appointment as needed.   We will contact you with your monitor results.

## 2015-10-13 NOTE — Progress Notes (Signed)
OFFICE NOTE  Chief Complaint:  Dizziness, pre-syncope  Primary Care Physician: Clementeen Graham, MD  HPI:  Russell Ramos is a 40 y.o. male who recently established care with Dr. Denyse Amass after an ER visit. He has a past medical history of pulmonary embolism in 2016 after knee surgery which was moderate to large and caused right heart strain. An echocardiogram performed at [facility] indicated severe RV hypokinesis, normal LV systolic and diastolic function and mild to moderate TR with an RVSP of 50-60 mmHg. Again, this was at the time when he had acute pulmonary embolus. He was treated with anticoagulation for 6 months until resolution of his pulmonary embolus. Since then he said no further problems with shortness of breath however did have an episode of dizziness and significant chest discomfort. He said it felt like his pulmonary embolus but he did not have any shortness of breath. He had been noncompliant with his medications and had had significant weight gain to over 400 pounds. Blood sugars were significantly elevated and blood pressure was not controlled. Since then, he is restarted his medications. Weight has come down to 288 pounds today. Blood pressure is now at goal 118/80. He reports over the past week or so since switching his blood sugar medications, particularly stopping metformin and starting Latuda, that he feels much better. He was having symptoms of dizziness and presyncope on a daily basis but has not had any in over a week. He was just fitted with his cardiac monitor yesterday and is still wearing it today, therefore we do not have any monitor results to review.  PMHx:  Past Medical History:  Diagnosis Date  . Diabetes mellitus without complication (HCC)   . Hypertension   . Pulmonary embolism (HCC)   . Pulmonary hypertension (HCC)     Past Surgical History:  Procedure Laterality Date  . KNEE SURGERY Left     FAMHx:  Family History  Problem Relation Age of Onset  .  Diabetes Mother     SOCHx:   reports that he quit smoking about 4 weeks ago. His smoking use included Cigarettes. He smoked 1.00 pack per day. He has never used smokeless tobacco. He reports that he drinks alcohol. He reports that he does not use drugs.  ALLERGIES:  No Known Allergies  ROS: Pertinent items noted in HPI and remainder of comprehensive ROS otherwise negative.  HOME MEDS: Current Outpatient Prescriptions  Medication Sig Dispense Refill  . AMBULATORY NON FORMULARY MEDICATION Single glucometer with lancets, test strips. Use glucometer of choice Test daily.  Uncontrolled Diabetes E11.9 1 each 0  . AMBULATORY NON FORMULARY MEDICATION Continuous positive airway pressure (CPAP) machine auto titrate to a max pressure of 15 cm of H2O pressure, with all supplemental supplies as needed. AHI 13.3 1 each 0  . atorvastatin (LIPITOR) 40 MG tablet Take 1 tablet (40 mg total) by mouth daily. 90 tablet 1  . Dulaglutide (TRULICITY) 1.5 MG/0.5ML SOPN Inject 1.5 mg into the skin once a week. 4 pen 12  . lisinopril (PRINIVIL,ZESTRIL) 5 MG tablet Take 1 tablet (5 mg total) by mouth daily. 90 tablet 1   No current facility-administered medications for this visit.     LABS/IMAGING: No results found for this or any previous visit (from the past 48 hour(s)). No results found.  WEIGHTS: Wt Readings from Last 3 Encounters:  10/13/15 288 lb 6.4 oz (130.8 kg)  09/29/15 287 lb (130.2 kg)  09/16/15 289 lb (131.1 kg)    VITALS:  BP 118/80 (BP Location: Right Arm, Patient Position: Sitting, Cuff Size: Large)   Pulse 75   Ht 5' 8.25" (1.734 m)   Wt 288 lb 6.4 oz (130.8 kg)   BMI 43.53 kg/m   EXAM: General appearance: alert and no distress Neck: no carotid bruit and no JVD Lungs: clear to auscultation bilaterally Heart: regular rate and rhythm, S1, S2 normal, no murmur, click, rub or gallop Abdomen: soft, non-tender; bowel sounds normal; no masses,  no organomegaly and o  bese Extremities: extremities normal, atraumatic, no cyanosis or edema Pulses: 2+ and symmetric Skin: Skin color, texture, turgor normal. No rashes or lesions Neurologic: Grossly normal Psych: Pleasant  EKG: I personally reviewed an EKG from 09/29/2015 at his primary care doctor's office indicating normal sinus rhythm  ASSESSMENT: 1. Dizziness/presyncope-probably related to poorly controlled diabetes and hypertension 2. History of postoperative pulmonary embolus with right heart strain 3. Hypertension 4. Type 2 diabetes 5. Dyslipidemia 6. Morbid obesity  PLAN: 1.   Mr. Sheralyn BoatmanMestas has numerous cardiac risk factors and is describing dizziness and presyncope which was worse but has improved significantly with changes in his medications and improvement in his blood pressure and diabetes. I think is mostly related to this. He is currently wearing a monitor but has not had any symptoms symptoms over the past 24 hours. I will review that monitor and contact him with results of that. I did review the echocardiogram report from [facility] in 2016, which showed normal systolic function and normal diastolic function of the left ventricle however there was severe RV dilatation and hypokinesis with an RVSP between 50 and 60 mmHg. This is related to acute pulmonary embolus with right heart strain. His symptoms have improved, he denies any shortness of breath. Although we could consider a repeat echo to review his RV comments likely that that has recovered. He feels well, has started exercising, recently quit smoking and has lost over 120 pounds.  Thanks for the kind referral. Again, I will review his monitor and if there are any abnormalities recommend further workup, otherwise he should continue his current treatment as he is feeling better and making good progress with smoking cessation and weight loss.  Chrystie NoseKenneth C. Paticia Moster, MD, [facility]ACC Attending Cardiologist [facility]  Chrystie NoseKenneth C Lashe Oliveira 10/13/2015, 10:52 AM

## 2015-10-14 ENCOUNTER — Ambulatory Visit: Payer: BLUE CROSS/BLUE SHIELD | Admitting: Family Medicine

## 2015-10-27 ENCOUNTER — Ambulatory Visit (INDEPENDENT_AMBULATORY_CARE_PROVIDER_SITE_OTHER): Payer: BLUE CROSS/BLUE SHIELD | Admitting: Family Medicine

## 2015-10-27 VITALS — BP 113/76 | HR 81 | Wt 285.0 lb

## 2015-10-27 DIAGNOSIS — R1013 Epigastric pain: Secondary | ICD-10-CM | POA: Diagnosis not present

## 2015-10-27 DIAGNOSIS — Z23 Encounter for immunization: Secondary | ICD-10-CM

## 2015-10-27 MED ORDER — OMEPRAZOLE 40 MG PO CPDR: 40.0000 mg | DELAYED_RELEASE_CAPSULE | Freq: Every day | ORAL | 3 refills | Status: DC

## 2015-10-27 NOTE — Patient Instructions (Signed)
Thank you for coming in today. Try omeprazole daily.  Follow up in 1 month.    Indigestion Indigestion is a feeling of pain, discomfort, burning, or fullness in the upper part of your abdomen. It can come and go. It may occur frequently or rarely. Indigestion tends to occur while you are eating or right after you have finished eating. It may be worse at night and while bending over or lying down. HOME CARE INSTRUCTIONS Take these actions to decrease your pain or discomfort and to help avoid complications. Diet  Follow a diet as recommended by your health care provider. This may involve avoiding foods and drinks such as:  Coffee and tea (with or without caffeine).  Drinks that contain alcohol.  Energy drinks and sports drinks.  Carbonated drinks or sodas.  Chocolate and cocoa.  Peppermint and mint flavorings.  Garlic and onions.  Horseradish.  Spicy and acidic foods, including peppers, chili powder, curry powder, vinegar, hot sauces, and barbecue sauce.  Citrus fruit juices and citrus fruits, such as oranges, lemons, and limes.  Tomato-based foods, such as red sauce, chili, salsa, and pizza with red sauce.  Fried and fatty foods, such as donuts, french fries, potato chips, and high-fat dressings.  High-fat meats, such as hot dogs and fatty cuts of red and white meats, such as rib eye steak, sausage, ham, and bacon.  High-fat dairy items, such as whole milk, butter, and cream cheese.  Eat small, frequent meals instead of large meals.  Avoid drinking large amounts of liquid with your meals.  Avoid eating meals during the 2-3 hours before bedtime.  Avoid lying down right after you eat.  Do not exercise right after you eat. General Instructions  Pay attention to any changes in your symptoms.  Take over-the-counter and prescription medicines only as told by your health care provider. Do not take aspirin, ibuprofen, or other NSAIDs unless your health care provider told  you to do so.  Do not use any tobacco products, including cigarettes, chewing tobacco, and e-cigarettes. If you need help quitting, ask your health care provider.  Wear loose-fitting clothing. Do not wear anything tight around your waist that causes pressure on your abdomen.  Raise (elevate) the head of your bed about 6 inches (15 cm).  Try to reduce your stress, such as with yoga or meditation. If you need help reducing stress, ask your health care provider.  If you are overweight, reduce your weight to an amount that is healthy for you. Ask your health care provider for guidance about a safe weight loss goal.  Keep all follow-up visits as told by your health care provider. This is important. SEEK MEDICAL CARE IF:  You have new symptoms.  You have unexplained weight loss.  You have difficulty swallowing, or it hurts to swallow.  Your symptoms do not improve with treatment.  Your symptoms last for more than two days.  You have a fever.  You vomit. SEEK IMMEDIATE MEDICAL CARE IF:  You have pain in your arms, neck, jaw, teeth, or back.  You feel sweaty, dizzy, or light-headed.  You faint.  You have chest pain or shortness of breath.  You cannot stop vomiting, or you vomit blood.  Your stool is bloody or black.  You have severe pain in your abdomen.   This information is not intended to replace advice given to you by your health care provider. Make sure you discuss any questions you have with your health care provider.  Document Released: 03/15/2004 Document Revised: 10/27/2014 Document Reviewed: 06/02/2014 Elsevier Interactive Patient Education [employer] Elsevier Inc.

## 2015-10-27 NOTE — Progress Notes (Signed)
Russell Ramos is a 40 y.o. male who presents to [name] today for follow up of episodic palpitations, chest discomfort, and dyspepsia.  He reports improvement in the frequency and severity of episodes.  His last severe episode was 2 weeks ago prompting him to seek medical evaluation.  At that time, he experienced palpitations, chest discomfort, nausea, vomiting, and a sense of impending doom.  He does continue to have mild episodes daily consisting of chest discomfort and an upset stomach.  Most episodes last minutes at a time, but severe ones may last for over an hour.  He can't think of anything that provokes these episodes and claims they eventually resolve on their own without intervention.  Patient has checked his blood sugar during the episodes and claims it is usually around 130-140.  He saw a cardiologist on 10/13/15 and had a 24 hour Holter monitor placed, which came back unremarkable.  His symptoms have improved with dietary changes, better glucose and blood pressure control, and quitting smoking.    Of note, since switching to Trulicity from metformin he no longer has diarrhea.     Past Medical History:  Diagnosis Date  . Diabetes mellitus without complication (HCC)   . Hypertension   . Pulmonary embolism (HCC)   . Pulmonary hypertension (HCC)    Past Surgical History:  Procedure Laterality Date  . KNEE SURGERY Left    Social History  Substance Use Topics  . Smoking status: Former Smoker    Packs/day: 1.00    Types: Cigarettes    Quit date: 09/12/2015  . Smokeless tobacco: Never Used  . Alcohol use Yes     Comment: socially   family history includes Diabetes in his mother.  ROS as above:  Medications: Current Outpatient Prescriptions  Medication Sig Dispense Refill  . AMBULATORY NON FORMULARY MEDICATION Single glucometer with lancets, test  strips. Use glucometer of choice Test daily.  Uncontrolled Diabetes E11.9 1 each 0  . AMBULATORY NON FORMULARY MEDICATION Continuous positive airway pressure (CPAP) machine auto titrate to a max pressure of 15 cm of H2O pressure, with all supplemental supplies as needed. AHI 13.3 1 each 0  . atorvastatin (LIPITOR) 40 MG tablet Take 1 tablet (40 mg total) by mouth daily. 90 tablet 1  . Dulaglutide (TRULICITY) 1.5 MG/0.5ML SOPN Inject 1.5 mg into the skin once a week. 4 pen 12  . lisinopril (PRINIVIL,ZESTRIL) 5 MG tablet Take 1 tablet (5 mg total) by mouth daily. 90 tablet 1   No current facility-administered medications for this visit.    No Known Allergies   Exam:  BP 113/76   Pulse 81   Wt 285 lb (129.3 kg)   BMI 43.02 kg/m  Gen: Well NAD Lungs: Normal work of breathing. CTABL Heart: RRR no MRG Abd: NABS, Soft. Nondistended, Nontender Exts: Brisk capillary refill, warm and well perfused.  Psych:  Appropriate mood and affect.  Normal speech and mentation  Depression screen [facility] 2/9 10/27/2015 09/29/2015  Decreased Interest 0 0  Down, Depressed, Hopeless 0 0  PHQ - 2 Score 0 0  Altered sleeping 3 1  Tired, decreased energy 3 3  Change in appetite 1 0  Feeling bad or failure about yourself  0 0  Trouble concentrating 0 0  Moving slowly or fidgety/restless 0 0  Suicidal thoughts 0 0  PHQ-9 Score 7 4   GAD 7 : Generalized Anxiety Score 10/27/2015 09/29/2015  Nervous,  Anxious, on Edge 3 0  Control/stop worrying 3 0  Worry too much - different things 0 0  Trouble relaxing 1 0  Restless 0 0  Easily annoyed or irritable 1 0  Afraid - awful might happen 3 0  Total GAD 7 Score 11 0  Anxiety Difficulty Somewhat difficult Not difficult at all      Assessment and Plan: 40 y.o. male with episodes of chest discomfort, upset stomach, and palpitations.  His symptoms are unlikely to be of cardiac origin given his recent negative cardiac workup.  GI causes such as esophageal spasm,  peptic ulcer disease, and GERD could be contributing to his symptoms.   - H. Pylori breath test today - Omeprazole 40 mg daily after the test, will add antibiotic if H. Pylori test comes back positive - Follow up in a month.  If not improved may consider a referral to GI.    Low testosterone:  Picked up on screening labs last month.  He denies problems with erection or libido.  He does admit to daily fatigue but this is likely related to decreased sleep secondary to sleep apnea.  Patient is having difficulty with the nasal CPAP due to sinus congestion from allergies.   - Call CPAP company to switch to a mask - Instructed to take OTC Zyrtec daily for allergies.  May try Flonase or other steroid nasal spray if needed.  No orders of the defined types were placed in this encounter.   Discussed warning signs or symptoms. Please see discharge instructions. Patient expresses understanding.

## 2015-10-28 LAB — H. PYLORI BREATH TEST

## 2015-11-09 ENCOUNTER — Telehealth: Payer: Self-pay | Admitting: *Deleted

## 2015-11-09 NOTE — Telephone Encounter (Signed)
PA request for Trulicity has been approved through covermymeds Your request has been approved  [id number];AOZHYQMCaseId:[id number];Product Name:ST: Adlyxin, Byetta, Bydureon, Tanzeum, Trulicity, Victoza PA 75 - ESI;Status:Approved;Coverage Start Date:10/10/2015;Coverage End Date:11/08/2018;   Patient notified via vm and pharm notified

## 2015-11-22 ENCOUNTER — Encounter: Payer: Self-pay | Admitting: Family Medicine

## 2015-11-22 ENCOUNTER — Ambulatory Visit (INDEPENDENT_AMBULATORY_CARE_PROVIDER_SITE_OTHER): Payer: BLUE CROSS/BLUE SHIELD | Admitting: Family Medicine

## 2015-11-22 DIAGNOSIS — F410 Panic disorder [episodic paroxysmal anxiety] without agoraphobia: Secondary | ICD-10-CM

## 2015-11-22 MED ORDER — CLONAZEPAM 0.5 MG PO TABS: 0.5000 mg | ORAL_TABLET | Freq: Two times a day (BID) | ORAL | 0 refills | Status: DC

## 2015-11-22 MED ORDER — SERTRALINE HCL 25 MG PO TABS: ORAL_TABLET | ORAL | 0 refills | Status: DC

## 2015-11-22 NOTE — Patient Instructions (Signed)
Thank you for coming in today. Return in 2 weeks.  Start Klonopin twice daily for immediate control of anxiety.  Start Zoloft daily for anxiety. After 1 week increase to 2 pills daily.  Recheck in 2 weeks.  I think you have anxiety and possibly panic attacks.  Get morning fasting labs soonish.  You should also hear from counseling people soon. Let me know if you do not hear anything in 1 week.   Panic Attacks Panic attacks are sudden, short-livedsurges of severe anxiety, fear, or discomfort. They may occur for no reason when you are relaxed, when you are anxious, or when you are sleeping. Panic attacks may occur for a number of reasons:   Healthy people occasionally have panic attacks in extreme, life-threatening situations, such as war or natural disasters. Normal anxiety is a protective mechanism of the body that helps us react to danger (fight or flight response).  Panic attacks are often seen with anxiety disorders, such as panic disorder, social anxiety disorder, generalized anxiety disorder, and phobias. Anxiety disorders cause excessive or uncontrollable anxiety. They may interfere with your relationships or other life activities.  Panic attacks are sometimes seen with other mental illnesses, such as depression and posttraumatic stress disorder.  Certain medical conditions, prescription medicines, and drugs of abuse can cause panic attacks. SYMPTOMS  Panic attacks start suddenly, peak within 20 minutes, and are accompanied by four or more of the following symptoms:  Pounding heart or fast heart rate (palpitations).  Sweating.  Trembling or shaking.  Shortness of breath or feeling smothered.  Feeling choked.  Chest pain or discomfort.  Nausea or strange feeling in your stomach.  Dizziness, light-headedness, or feeling like you will faint.  Chills or hot flushes.  Numbness or tingling in your lips or hands and feet.  Feeling that things are not real or feeling that you  are not yourself.  Fear of losing control or going crazy.  Fear of dying. Some of these symptoms can mimic serious medical conditions. For example, you may think you are having a heart attack. Although panic attacks can be very scary, they are not life threatening. DIAGNOSIS  Panic attacks are diagnosed through an assessment by your health care provider. Your health care provider will ask questions about your symptoms, such as where and when they occurred. Your health care provider will also ask about your medical history and use of alcohol and drugs, including prescription medicines. Your health care provider may order blood tests or other studies to rule out a serious medical condition. Your health care provider may refer you to a mental health professional for further evaluation. TREATMENT   Most healthy people who have one or two panic attacks in an extreme, life-threatening situation will not require treatment.  The treatment for panic attacks associated with anxiety disorders or other mental illness typically involves counseling with a mental health professional, medicine, or a combination of both. Your health care provider will help determine what treatment is best for you.  Panic attacks due to physical illness usually go away with treatment of the illness. If prescription medicine is causing panic attacks, talk with your health care provider about stopping the medicine, decreasing the dose, or substituting another medicine.  Panic attacks due to alcohol or drug abuse go away with abstinence. Some adults need professional help in order to stop drinking or using drugs. HOME CARE INSTRUCTIONS   Take all medicines as directed by your health care provider.   Schedule and attend follow-up  visits as directed by your health care provider. It is important to keep all your appointments. SEEK MEDICAL CARE IF:  You are not able to take your medicines as prescribed.  Your symptoms do not  improve or get worse. SEEK IMMEDIATE MEDICAL CARE IF:   You experience panic attack symptoms that are different than your usual symptoms.  You have serious thoughts about hurting yourself or others.  You are taking medicine for panic attacks and have a serious side effect. MAKE SURE YOU:  Understand these instructions.  Will watch your condition.  Will get help right away if you are not doing well or get worse.   This information is not intended to replace advice given to you by your health care provider. Make sure you discuss any questions you have with your health care provider.   Document Released: 02/05/2005 Document Revised: 02/10/2013 Document Reviewed: 09/19/2012 Elsevier Interactive Patient Education [employer]

## 2015-11-22 NOTE — Progress Notes (Signed)
Russell Ramos is a 40 y.o. male who [facility] today for anxiety. Patient continues to experience significant chest symptoms. He's had multiple extensive evaluations with no obvious etiology. He describes a feeling of severe anxiety associated with chest pressure heart racing nausea and sweating. In retroflexion he notes a significant family history for anxiety and panic disorder. Although he has never been diagnosed with any mental health problems. His symptoms are severe and bothersome. He notes that when he takes alcohol his anxiety symptoms tended to improve however he does not drink alcohol regularly.   Past Medical History:  Diagnosis Date  . Diabetes mellitus without complication (HCC)   . Hypertension   . Pulmonary embolism (HCC)   . Pulmonary hypertension    Past Surgical History:  Procedure Laterality Date  . KNEE SURGERY Left    Social History  Substance Use Topics  . Smoking status: Former Smoker    Packs/day: 1.00    Types: Cigarettes    Quit date: 09/12/2015  . Smokeless tobacco: Never Used  . Alcohol use Yes     Comment: socially   family history includes Diabetes in his mother.  ROS as above:  Medications: Current Outpatient Prescriptions  Medication Sig Dispense Refill  . AMBULATORY NON FORMULARY MEDICATION Single glucometer with lancets, test strips. Use glucometer of choice Test daily.  Uncontrolled Diabetes E11.9 1 each 0  . AMBULATORY NON FORMULARY MEDICATION Continuous positive airway pressure (CPAP) machine auto titrate to a max pressure of 15 cm of H2O pressure, with all supplemental supplies as needed. AHI 13.3 1 each 0  . atorvastatin (LIPITOR) 40 MG tablet Take 1 tablet (40 mg total) by mouth daily. 90 tablet 1  . Dulaglutide (TRULICITY) 1.5 MG/0.5ML SOPN Inject 1.5 mg into the skin once a week. 4 pen 12  .  lisinopril (PRINIVIL,ZESTRIL) 5 MG tablet Take 1 tablet (5 mg total) by mouth daily. 90 tablet 1  . omeprazole (PRILOSEC) 40 MG capsule Take 1 capsule (40 mg total) by mouth daily. 30 capsule 3  . clonazePAM (KLONOPIN) 0.5 MG tablet Take 1 tablet (0.5 mg total) by mouth 2 (two) times daily. 60 tablet 0  . sertraline (ZOLOFT) 25 MG tablet Take 1 pill daily for 1 week and then increase to 2 pills daily po. 30 tablet 0   No current facility-administered medications for this visit.    No Known Allergies   Exam:  BP 120/90   Pulse 86   Wt 292 lb (132.5 kg)   BMI 44.07 kg/m  Gen: Well NAD HEENT: EOMI,  MMM Lungs: Normal work of breathing. CTABL Heart: RRR no MRG Abd: NABS, Soft. Nondistended, Nontender Exts: Brisk capillary refill, warm and well perfused.  Psych: Alert and oriented. Affect is tearful at time however thought process is linear and goal-directed. No SI or HI expressed.  [facility] 11/22/2015 10/27/2015 09/29/2015  Decreased Interest 1 0 0  Down, Depressed, Hopeless 2 0 0  PHQ - 2 Score 3 0 0  Altered sleeping 3 3 1   Tired, decreased energy 3 3 3   Change in appetite 0 1 0  Feeling bad or failure about yourself  1 0 0  Trouble concentrating 1 0 0  Moving slowly or fidgety/restless 0 0 0  Suicidal thoughts - 0 0  PHQ-9 Score 11 7 4   Difficult doing work/chores Very difficult - -   GAD 7 : Generalized Anxiety  Score 11/22/2015 10/27/2015 09/29/2015  Nervous, Anxious, on Edge 2 3 0  Control/stop worrying 3 3 0  Worry too much - different things 1 0 0  Trouble relaxing 3 1 0  Restless 0 0 0  Easily annoyed or irritable 2 1 0  Afraid - awful might happen 3 3 0  Total GAD 7 Score 14 11 0  Anxiety Difficulty Very difficult Somewhat difficult Not difficult at all      No results found for this or any previous visit (from the past 24 hour(s)). No results found.    Assessment and Plan: 40 y.o. male with  Anxiety: I believe patient's symptoms are very  likely to be a panic attack. He likely also has coexisting generalized anxiety disorder and possibly some oppression symptoms. Plan to start low-dose Klonopin twice daily and Zoloft daily now. We'll refer to counseling for cognitive behavioral therapy or other therapy options may be beneficial. Recheck in 2 weeks.   No orders of the defined types were placed in this encounter.   Discussed warning signs or symptoms. Please see discharge instructions. Patient expresses understanding.

## 2015-11-24 ENCOUNTER — Ambulatory Visit: Payer: BLUE CROSS/BLUE SHIELD | Admitting: Family Medicine

## 2015-11-29 ENCOUNTER — Telehealth: Payer: Self-pay | Admitting: Family Medicine

## 2015-11-29 DIAGNOSIS — F410 Panic disorder [episodic paroxysmal anxiety] without agoraphobia: Secondary | ICD-10-CM

## 2015-11-29 NOTE — Telephone Encounter (Signed)
Refer to psychology ordered.

## 2015-12-06 ENCOUNTER — Ambulatory Visit: Payer: BLUE CROSS/BLUE SHIELD | Admitting: Family Medicine

## 2015-12-07 ENCOUNTER — Ambulatory Visit (INDEPENDENT_AMBULATORY_CARE_PROVIDER_SITE_OTHER): Payer: BLUE CROSS/BLUE SHIELD | Admitting: Family Medicine

## 2015-12-07 ENCOUNTER — Encounter: Payer: Self-pay | Admitting: Family Medicine

## 2015-12-07 VITALS — BP 130/75 | HR 70 | Wt 294.0 lb

## 2015-12-07 DIAGNOSIS — F410 Panic disorder [episodic paroxysmal anxiety] without agoraphobia: Secondary | ICD-10-CM | POA: Diagnosis not present

## 2015-12-07 MED ORDER — CLONAZEPAM 0.5 MG PO TABS: 0.5000 mg | ORAL_TABLET | Freq: Two times a day (BID) | ORAL | 0 refills | Status: DC

## 2015-12-07 MED ORDER — SERTRALINE HCL 100 MG PO TABS: 100.0000 mg | ORAL_TABLET | Freq: Every day | ORAL | 1 refills | Status: DC

## 2015-12-07 NOTE — Patient Instructions (Signed)
Thank you for coming in today. Return in 1 month.  Increase zoloft to 100mg  in the next few days.   Recheck in 1 month.

## 2015-12-07 NOTE — Progress Notes (Signed)
Russell Ramos is a 40 y.o. male who [facility] today for follow up anxiety.   Patient was seen a few weeks ago for continued anxiety symptoms. These are thought to be related to panic attacks. At that time he was started on Zoloft and Klonopin and referred to counseling. The interim he has attended counseling once which he finds helpful. He has titrated his Zoloft to 50 mg. He notes he tolerates this medication well. He finds Klonopin to be significantly helpful. He feels much better.   Past Medical History:  Diagnosis Date  . Diabetes mellitus without complication (HCC)   . Hypertension   . Pulmonary embolism (HCC)   . Pulmonary hypertension    Past Surgical History:  Procedure Laterality Date  . KNEE SURGERY Left    Social History  Substance Use Topics  . Smoking status: Former Smoker    Packs/day: 1.00    Types: Cigarettes    Quit date: 09/12/2015  . Smokeless tobacco: Never Used  . Alcohol use Yes     Comment: socially   family history includes Diabetes in his mother.  ROS as above:  Medications: Current Outpatient Prescriptions  Medication Sig Dispense Refill  . AMBULATORY NON FORMULARY MEDICATION Single glucometer with lancets, test strips. Use glucometer of choice Test daily.  Uncontrolled Diabetes E11.9 1 each 0  . AMBULATORY NON FORMULARY MEDICATION Continuous positive airway pressure (CPAP) machine auto titrate to a max pressure of 15 cm of H2O pressure, with all supplemental supplies as needed. AHI 13.3 1 each 0  . atorvastatin (LIPITOR) 40 MG tablet Take 1 tablet (40 mg total) by mouth daily. 90 tablet 1  . clonazePAM (KLONOPIN) 0.5 MG tablet Take 1 tablet (0.5 mg total) by mouth 2 (two) times daily. 60 tablet 0  . Dulaglutide (TRULICITY) 1.5 MG/0.5ML SOPN Inject 1.5 mg into the skin once a week. 4 pen 12  . lisinopril  (PRINIVIL,ZESTRIL) 5 MG tablet Take 1 tablet (5 mg total) by mouth daily. 90 tablet 1  . omeprazole (PRILOSEC) 40 MG capsule Take 1 capsule (40 mg total) by mouth daily. 30 capsule 3  . sertraline (ZOLOFT) 100 MG tablet Take 1 tablet (100 mg total) by mouth daily. Take 1 pill daily for 1 week and then increase to 2 pills daily po. 90 tablet 1   No current facility-administered medications for this visit.    No Known Allergies  Health Maintenance Health Maintenance  Topic Date Due  . OPHTHALMOLOGY EXAM  07/20/1987  . HEMOGLOBIN A1C  03/16/2016  . FOOT EXAM  09/15/2016  . PNEUMOCOCCAL POLYSACCHARIDE VACCINE (2) 09/15/2020  . TETANUS/TDAP  09/15/2025  . INFLUENZA VACCINE  Completed  . HIV Screening  Completed     Exam:  BP 130/75   Pulse 70   Wt 294 lb (133.4 kg)   BMI 44.38 kg/m  Psych: Alert and oriented normal speech process and affect.  GAD 7 : Generalized Anxiety Score 12/07/2015 11/22/2015 10/27/2015 09/29/2015  Nervous, Anxious, on Edge 3 2 3  0  Control/stop worrying 3 3 3  0  Worry too much - different things 1 1 0 0  Trouble relaxing 2 3 1  0  Restless 0 0 0 0  Easily annoyed or irritable 1 2 1  0  Afraid - awful might happen 3 3 3  0  Total GAD 7 Score 13 14 11  0  Anxiety Difficulty Somewhat difficult Very difficult Somewhat difficult Not  difficult at all    Depression screen [facility] 2/9 12/07/2015 11/22/2015 10/27/2015 09/29/2015  Decreased Interest 0 1 0 0  Down, Depressed, Hopeless 1 2 0 0  PHQ - 2 Score 1 3 0 0  Altered sleeping 2 3 3 1   Tired, decreased energy 3 3 3 3   Change in appetite 0 0 1 0  Feeling bad or failure about yourself  1 1 0 0  Trouble concentrating 1 1 0 0  Moving slowly or fidgety/restless 0 0 0 0  Suicidal thoughts 0 - 0 0  PHQ-9 Score 8 11 7 4   Difficult doing work/chores - Very difficult - -       No results found for this or any previous visit (from the past 72 hour(s)). No results found.    Assessment and Plan: 40 y.o. male with    Anxiety disorder improved. Titrate Zoloft 100 mg continue Klonopin) recheck in 1 month. Continue counseling.   No orders of the defined types were placed in this encounter.   Discussed warning signs or symptoms. Please see discharge instructions. Patient expresses understanding.

## 2015-12-08 LAB — PSA: PSA: 0.3 ng/mL

## 2015-12-08 LAB — BASIC METABOLIC PANEL WITH GFR
BUN: 18 mg/dL (ref 7–25)
CO2: 22 mmol/L (ref 20–31)
Calcium: 9.6 mg/dL (ref 8.6–10.3)
Chloride: 100 mmol/L (ref 98–110)
Creat: 0.92 mg/dL (ref 0.60–1.35)
Glucose, Bld: 177 mg/dL — ABNORMAL HIGH (ref 65–99)
Potassium: 4.7 mmol/L (ref 3.5–5.3)
Sodium: 137 mmol/L (ref 135–146)

## 2015-12-08 LAB — TESTOSTERONE TOTAL,FREE,BIO, MALES
ALBUMIN: 4.3 g/dL (ref 3.6–5.1)
Sex Hormone Binding: 20 nmol/L (ref 10–50)
Testosterone: 215 ng/dL — ABNORMAL LOW (ref 250–827)

## 2015-12-08 LAB — LUTEINIZING HORMONE: LH: 3.1 m[IU]/mL (ref 1.5–9.3)

## 2015-12-08 LAB — FOLLICLE STIMULATING HORMONE: FSH: 3.4 m[IU]/mL (ref 1.6–8.0)

## 2015-12-09 ENCOUNTER — Other Ambulatory Visit: Payer: Self-pay | Admitting: *Deleted

## 2015-12-09 MED ORDER — OMEPRAZOLE 40 MG PO CPDR: 40.0000 mg | DELAYED_RELEASE_CAPSULE | Freq: Every day | ORAL | 0 refills | Status: DC

## 2015-12-15 ENCOUNTER — Other Ambulatory Visit: Payer: Self-pay | Admitting: Family Medicine

## 2015-12-15 MED ORDER — SERTRALINE HCL 100 MG PO TABS: 100.0000 mg | ORAL_TABLET | Freq: Every day | ORAL | 1 refills | Status: DC

## 2015-12-15 NOTE — Progress Notes (Signed)
Corrected Zoloft dose instructions.

## 2016-01-01 ENCOUNTER — Other Ambulatory Visit: Payer: Self-pay | Admitting: Family Medicine

## 2016-01-09 ENCOUNTER — Ambulatory Visit: Payer: BLUE CROSS/BLUE SHIELD | Admitting: Family Medicine

## 2016-01-16 ENCOUNTER — Encounter: Payer: Self-pay | Admitting: Family Medicine

## 2016-01-16 ENCOUNTER — Ambulatory Visit (INDEPENDENT_AMBULATORY_CARE_PROVIDER_SITE_OTHER): Payer: BLUE CROSS/BLUE SHIELD | Admitting: Family Medicine

## 2016-01-16 VITALS — BP 131/71 | HR 74

## 2016-01-16 DIAGNOSIS — F410 Panic disorder [episodic paroxysmal anxiety] without agoraphobia: Secondary | ICD-10-CM | POA: Diagnosis not present

## 2016-01-16 DIAGNOSIS — E1169 Type 2 diabetes mellitus with other specified complication: Secondary | ICD-10-CM

## 2016-01-16 DIAGNOSIS — E782 Mixed hyperlipidemia: Secondary | ICD-10-CM

## 2016-01-16 DIAGNOSIS — E349 Endocrine disorder, unspecified: Secondary | ICD-10-CM

## 2016-01-16 DIAGNOSIS — E1165 Type 2 diabetes mellitus with hyperglycemia: Secondary | ICD-10-CM | POA: Diagnosis not present

## 2016-01-16 DIAGNOSIS — E119 Type 2 diabetes mellitus without complications: Secondary | ICD-10-CM

## 2016-01-16 DIAGNOSIS — R7989 Other specified abnormal findings of blood chemistry: Secondary | ICD-10-CM

## 2016-01-16 LAB — POCT GLYCOSYLATED HEMOGLOBIN (HGB A1C): Hemoglobin A1C: 7.6

## 2016-01-16 MED ORDER — BUPROPION HCL ER (XL) 150 MG PO TB24: 150.0000 mg | ORAL_TABLET | Freq: Every day | ORAL | 0 refills | Status: DC

## 2016-01-16 MED ORDER — CLONAZEPAM 0.5 MG PO TABS: 0.5000 mg | ORAL_TABLET | Freq: Two times a day (BID) | ORAL | 0 refills | Status: DC

## 2016-01-16 NOTE — Progress Notes (Signed)
Russell Ramos is a 40 y.o. male who [facility] today for follow-up anxiety depression and diabetes.  Anxiety/depression: Doing well with Zoloft 100 and clonazepam about once daily. He notes symptoms are much improved than previously. He continues to experience panic attacks occasionally. He notes the Zoloft tends to cause more fatigue and he has difficulty achieving orgasm. Otherwise he feels quite well. He denies significant erectile dysfunction or decreased libido.  Diabetes: Doing well with the below medications. Patient has been trying to eat a lower carbohydrate diet but was less adherent to a diet over the Thanksgiving break. He notes his sugars typically are 1:30 to 170 and denies any polyuria or polydipsia.   Past Medical History:  Diagnosis Date  . Diabetes mellitus without complication (HCC)   . Hypertension   . Pulmonary embolism (HCC)   . Pulmonary hypertension    Past Surgical History:  Procedure Laterality Date  . KNEE SURGERY Left    Social History  Substance Use Topics  . Smoking status: Former Smoker    Packs/day: 1.00    Types: Cigarettes    Quit date: 09/12/2015  . Smokeless tobacco: Never Used  . Alcohol use Yes     Comment: socially   family history includes Diabetes in his mother.  ROS as above:  Medications: Current Outpatient Prescriptions  Medication Sig Dispense Refill  . AMBULATORY NON FORMULARY MEDICATION Single glucometer with lancets, test strips. Use glucometer of choice Test daily.  Uncontrolled Diabetes E11.9 1 each 0  . AMBULATORY NON FORMULARY MEDICATION Continuous positive airway pressure (CPAP) machine auto titrate to a max pressure of 15 cm of H2O pressure, with all supplemental supplies as needed. AHI 13.3 1 each 0  . atorvastatin (LIPITOR) 40 MG tablet Take 1 tablet (40 mg total) by mouth daily.  90 tablet 1  . clonazePAM (KLONOPIN) 0.5 MG tablet Take 1 tablet (0.5 mg total) by mouth 2 (two) times daily. 60 tablet 0  . Dulaglutide (TRULICITY) 1.5 MG/0.5ML SOPN Inject 1.5 mg into the skin once a week. 4 pen 12  . lisinopril (PRINIVIL,ZESTRIL) 5 MG tablet Take 1 tablet (5 mg total) by mouth daily. 90 tablet 1  . omeprazole (PRILOSEC) 40 MG capsule Take 1 capsule (40 mg total) by mouth daily. 90 capsule 0  . sertraline (ZOLOFT) 100 MG tablet Take 1 tablet (100 mg total) by mouth daily. 90 tablet 1  . buPROPion (WELLBUTRIN XL) 150 MG 24 hr tablet Take 1 tablet (150 mg total) by mouth daily. 90 tablet 0   No current facility-administered medications for this visit.    No Known Allergies  Health Maintenance Health Maintenance  Topic Date Due  . OPHTHALMOLOGY EXAM  07/20/1987  . HEMOGLOBIN A1C  03/16/2016  . FOOT EXAM  09/15/2016  . PNEUMOCOCCAL POLYSACCHARIDE VACCINE (2) 09/15/2020  . TETANUS/TDAP  09/15/2025  . INFLUENZA VACCINE  Completed  . HIV Screening  Completed     Exam:  BP 131/71   Pulse 74  Gen: Well NAD Obese HEENT: EOMI,  MMM Lungs: Normal work of breathing. CTABL Heart: RRR no MRG Abd: NABS, Soft. Nondistended, Nontender Exts: Brisk capillary refill, warm and well perfused.  Psych: Alert and oriented normal speech thought process and affect.  [facility] 01/16/2016 12/07/2015 11/22/2015 10/27/2015 09/29/2015  Decreased Interest 0 0 1 0 0  Down, Depressed, Hopeless 0 1 2 0 0  PHQ - 2 Score 0  1 3 0 0  Altered sleeping 3 2 3 3 1   Tired, decreased energy 3 3 3 3 3   Change in appetite 1 0 0 1 0  Feeling bad or failure about yourself  1 1 1  0 0  Trouble concentrating 2 1 1  0 0  Moving slowly or fidgety/restless 0 0 0 0 0  Suicidal thoughts 0 0 - 0 0  PHQ-9 Score 10 8 11 7 4   Difficult doing work/chores Somewhat difficult - Very difficult - -   GAD 7 : Generalized Anxiety Score 01/16/2016 12/07/2015 11/22/2015 10/27/2015  Nervous, Anxious, on Edge 1 3  2 3   Control/stop worrying 1 3 3 3   Worry too much - different things 1 1 1  0  Trouble relaxing 1 2 3 1   Restless 0 0 0 0  Easily annoyed or irritable 1 1 2 1   Afraid - awful might happen 1 3 3 3   Total GAD 7 Score 6 13 14 11   Anxiety Difficulty Somewhat difficult Somewhat difficult Very difficult Somewhat difficult       Results for orders placed or performed in visit on 01/16/16 (from the past 72 hour(s))  POCT HgB A1C     Status: None   Collection Time: 01/16/16 11:42 AM  Result Value Ref Range   Hemoglobin A1C 7.6    No results found.    Assessment and Plan: 40 y.o. male with  Pressure and: Improved continue Zoloft and Wellbutrin recheck in 1-2 months.  Anxiety: Doing well. Continue to wean clonazepam continue Zoloft.  Diabetes: Much improved. Continued dietary adherence and medications. Recheck in a few months.  Delayed orgasm likely side effects due to Zoloft. Add Wellbutrin. Low testosterone may have something to do with this. We'll consider adding testosterone if not improved with Wellbutrin at the next check.   Orders Placed This Encounter  Procedures  . POCT HgB A1C    Discussed warning signs or symptoms. Please see discharge instructions. Patient expresses understanding.

## 2016-01-16 NOTE — Patient Instructions (Signed)
Thank you for coming in today. Start Wellbutrin daily.  Continue zoloft.  Work on tapering down or off the Consecoklonopin.  Return in 1-2 months.  Consider testosterone replacement.    Testosterone injection What is this medicine? TESTOSTERONE (tes TOS ter one) is the main male hormone. It supports normal male development such as muscle growth, facial hair, and deep voice. It is used in males to treat low testosterone levels. This medicine may be used for other purposes; ask your health care provider or pharmacist if you have questions. COMMON BRAND NAME(S): Andro-L.A., Aveed, Delatestryl, Depo-Testosterone, Virilon What should I tell my health care provider before I take this medicine? They need to know if you have any of these conditions: -cancer -diabetes -heart disease -kidney disease -liver disease -lung disease -prostate disease -an unusual or allergic reaction to testosterone, other medicines, foods, dyes, or preservatives -pregnant or trying to get pregnant -breast-feeding How should I use this medicine? This medicine is for injection into a muscle. It is usually given by a health care professional in a hospital or clinic setting. Contact your pediatrician regarding the use of this medicine in children. While this medicine may be prescribed for children as young as 612 years of age for selected conditions, precautions do apply. Overdosage: If you think you have taken too much of this medicine contact a poison control center or emergency room at once. NOTE: This medicine is only for you. Do not share this medicine with others. What if I miss a dose? Try not to miss a dose. Your doctor or health care professional will tell you when your next injection is due. Notify the office if you are unable to keep an appointment. What may interact with this medicine? -medicines for diabetes -medicines that treat or prevent blood clots like warfarin -oxyphenbutazone -propranolol -steroid  medicines like prednisone or cortisone This list may not describe all possible interactions. Give your health care provider a list of all the medicines, herbs, non-prescription drugs, or dietary supplements you use. Also tell them if you smoke, drink alcohol, or use illegal drugs. Some items may interact with your medicine. What should I watch for while using this medicine? Visit your doctor or health care professional for regular checks on your progress. They will need to check the level of testosterone in your blood. This medicine is only approved for use in men who have low levels of testosterone related to certain medical conditions. Heart attacks and strokes have been reported with the use of this medicine. Notify your doctor or health care professional and seek emergency treatment if you develop breathing problems; changes in vision; confusion; chest pain or chest tightness; sudden arm pain; severe, sudden headache; trouble speaking or understanding; sudden numbness or weakness of the face, arm or leg; loss of balance or coordination. Talk to your doctor about the risks and benefits of this medicine. This medicine may affect blood sugar levels. If you have diabetes, check with your doctor or health care professional before you change your diet or the dose of your diabetic medicine. Testosterone injections are not commonly used in women. Women should inform their doctor if they wish to become pregnant or think they might be pregnant. There is a potential for serious side effects to an unborn child. Talk to your health care professional or pharmacist for more information. Talk with your doctor or health care professional about your birth control options while taking this medicine. This drug is banned from use in athletes by most athletic  organizations. What side effects may I notice from receiving this medicine? Side effects that you should report to your doctor or health care professional as soon as  possible: -allergic reactions like skin rash, itching or hives, swelling of the face, lips, or tongue -breast enlargement -breathing problems -changes in emotions or moods -deep or hoarse voice -irregular menstrual periods -signs and symptoms of liver injury like dark yellow or brown urine; general ill feeling or flu-like symptoms; light-colored stools; loss of appetite; nausea; right upper belly pain; unusually weak or tired; yellowing of the eyes or skin -stomach pain -swelling of the ankles, feet, hands -too frequent or persistent erections -trouble passing urine or change in the amount of urine Side effects that usually do not require medical attention (report to your doctor or health care professional if they continue or are bothersome): -acne -change in sex drive or performance -facial hair growth -hair loss -headache This list may not describe all possible side effects. Call your doctor for medical advice about side effects. You may report side effects to FDA at 1-800-FDA-1088. Where should I keep my medicine? Keep out of the reach of children. This medicine can be abused. Keep your medicine in a safe place to protect it from theft. Do not share this medicine with anyone. Selling or giving away this medicine is dangerous and against the law. Store at room temperature between 20 and 25 degrees C (68 and 77 degrees F). Do not freeze. Protect from light. Follow the directions for the product you are prescribed. Throw away any unused medicine after the expiration date. NOTE: This sheet is a summary. It may not cover all possible information. If you have questions about this medicine, talk to your doctor, pharmacist, or health care provider.  2017 Elsevier/Gold Standard (2015-03-12 07:33:55)

## 2016-02-18 ENCOUNTER — Other Ambulatory Visit: Payer: Self-pay | Admitting: Family Medicine

## 2016-02-18 DIAGNOSIS — E782 Mixed hyperlipidemia: Secondary | ICD-10-CM

## 2016-02-18 DIAGNOSIS — E1165 Type 2 diabetes mellitus with hyperglycemia: Secondary | ICD-10-CM

## 2016-02-18 DIAGNOSIS — R5382 Chronic fatigue, unspecified: Secondary | ICD-10-CM

## 2016-02-18 DIAGNOSIS — R748 Abnormal levels of other serum enzymes: Secondary | ICD-10-CM

## 2016-02-18 DIAGNOSIS — E1169 Type 2 diabetes mellitus with other specified complication: Secondary | ICD-10-CM

## 2016-02-18 DIAGNOSIS — I10 Essential (primary) hypertension: Secondary | ICD-10-CM

## 2016-03-19 ENCOUNTER — Encounter: Payer: Self-pay | Admitting: Family Medicine

## 2016-03-19 ENCOUNTER — Ambulatory Visit (INDEPENDENT_AMBULATORY_CARE_PROVIDER_SITE_OTHER): Payer: BLUE CROSS/BLUE SHIELD | Admitting: Family Medicine

## 2016-03-19 VITALS — BP 125/61 | HR 84 | Wt 316.0 lb

## 2016-03-19 DIAGNOSIS — F410 Panic disorder [episodic paroxysmal anxiety] without agoraphobia: Secondary | ICD-10-CM

## 2016-03-19 DIAGNOSIS — E1165 Type 2 diabetes mellitus with hyperglycemia: Secondary | ICD-10-CM

## 2016-03-19 DIAGNOSIS — E349 Endocrine disorder, unspecified: Secondary | ICD-10-CM | POA: Diagnosis not present

## 2016-03-19 DIAGNOSIS — E291 Testicular hypofunction: Secondary | ICD-10-CM

## 2016-03-19 DIAGNOSIS — R7989 Other specified abnormal findings of blood chemistry: Secondary | ICD-10-CM

## 2016-03-19 MED ORDER — TESTOSTERONE CYPIONATE 200 MG/ML IM SOLN: 200.0000 mg | INTRAMUSCULAR | 0 refills | Status: DC

## 2016-03-19 MED ORDER — EMPAGLIFLOZIN 25 MG PO TABS: 25.0000 mg | ORAL_TABLET | Freq: Every day | ORAL | 2 refills | Status: DC

## 2016-03-19 MED ORDER — SITAGLIPTIN PHOSPHATE 100 MG PO TABS: 100.0000 mg | ORAL_TABLET | Freq: Every day | ORAL | 2 refills | Status: DC

## 2016-03-19 MED ORDER — AMBULATORY NON FORMULARY MEDICATION: 0 refills | Status: DC

## 2016-03-19 NOTE — Progress Notes (Signed)
Russell Ramos is a 41 y.o. male who presents to [name] today for  Follow-up anxiety, diabetes, and testosterone.  Anxiety: Patient notes his anxiety overall is improved however it is not well controlled enough. He notes significant drug or phobia and fear of having panic attacks. He is fearful of leaving the house and thinks that he would have trouble working as he is today as he is concerned about panic attack at work.  Diabetes: Patient is taking Trulicity currently. He denies any polyuria or polydipsia. He notes his average blood sugars tend to be around 200.  Decreased libido: Patient notes continued decreased libido. He is interested in starting testosterone.   Past Medical History:  Diagnosis Date  . Diabetes mellitus without complication (HCC)   . Hypertension   . Pulmonary embolism (HCC)   . Pulmonary hypertension    Past Surgical History:  Procedure Laterality Date  . KNEE SURGERY Left    Social History  Substance Use Topics  . Smoking status: Former Smoker    Packs/day: 1.00    Types: Cigarettes    Quit date: 09/12/2015  . Smokeless tobacco: Never Used  . Alcohol use Yes     Comment: socially   family history includes Diabetes in his mother.  ROS as above:  Medications: Current Outpatient Prescriptions  Medication Sig Dispense Refill  . AMBULATORY NON FORMULARY MEDICATION Single glucometer with lancets, test strips. Use glucometer of choice Test daily.  Uncontrolled Diabetes E11.9 1 each 0  . AMBULATORY NON FORMULARY MEDICATION Continuous positive airway pressure (CPAP) machine auto titrate to a max pressure of 15 cm of H2O pressure, with all supplemental supplies as needed. AHI 13.3 1 each 0  . atorvastatin (LIPITOR) 40 MG tablet Take 1 tablet (40 mg total) by mouth daily. 90 tablet 1  . buPROPion (WELLBUTRIN XL) 150 MG 24 hr  tablet Take 1 tablet (150 mg total) by mouth daily. 90 tablet 0  . clonazePAM (KLONOPIN) 0.5 MG tablet TAKE 1 TABLET BY MOUTH TWICE A DAY 60 tablet 0  . Dulaglutide (TRULICITY) 1.5 MG/0.5ML SOPN Inject 1.5 mg into the skin once a week. 4 pen 12  . lisinopril (PRINIVIL,ZESTRIL) 5 MG tablet TAKE 1 TABLET (5 MG TOTAL) BY MOUTH DAILY. 90 tablet 1  . omeprazole (PRILOSEC) 40 MG capsule TAKE 1 CAPSULE (40 MG TOTAL) BY MOUTH DAILY. 90 capsule 0  . sertraline (ZOLOFT) 100 MG tablet Take 1 tablet (100 mg total) by mouth daily. 90 tablet 1  . AMBULATORY NON FORMULARY MEDICATION 3ml syringe, 21g 1.5 inch needles and 18 gauge blunt filler needles for testosterone injections every 2 weeks. Disp qs x 3 months. 1 each 0  . empagliflozin (JARDIANCE) 25 MG TABS tablet Take 25 mg by mouth daily. 30 tablet 2  . testosterone cypionate (DEPOTESTOSTERONE CYPIONATE) 200 MG/ML injection Inject 1 mL (200 mg total) into the muscle every 14 (fourteen) days. 10 mL 0   No current facility-administered medications for this visit.    No Known Allergies  Health Maintenance Health Maintenance  Topic Date Due  . OPHTHALMOLOGY EXAM  07/20/1987  . HEMOGLOBIN A1C  07/15/2016  . FOOT EXAM  09/15/2016  . PNEUMOCOCCAL POLYSACCHARIDE VACCINE (2) 09/15/2020  . TETANUS/TDAP  09/15/2025  . INFLUENZA VACCINE  Completed  . HIV Screening  Completed     Exam:  BP 125/61   Pulse 84   Wt (!) 316 lb (143.3 kg)  SpO2 99%   BMI 47.70 kg/m  Gen: Well NAD Obese HEENT: EOMI,  MMM Lungs: Normal work of breathing. CTABL Heart: RRR no MRG Abd: NABS, Soft. Nondistended, Nontender Exts: Brisk capillary refill, warm and well perfused.  Psych: Alert and oriented normal speech and thought process and affect.  [facility] screen [facility] 03/19/2016 01/16/2016 12/07/2015 11/22/2015 10/27/2015  Decreased Interest 1 0 0 1 0  Down, Depressed, Hopeless 0 0 1 2 0  PHQ - 2 Score 1 0 1 3 0  Altered sleeping 1 3 2 3 3   Tired, decreased energy 3 3  3 3 3   Change in appetite 1 1 0 0 1  Feeling bad or failure about yourself  1 1 1 1  0  Trouble concentrating 0 2 1 1  0  Moving slowly or fidgety/restless 0 0 0 0 0  Suicidal thoughts 0 0 0 - 0  PHQ-9 Score 7 10 8 11 7   Difficult doing work/chores - Somewhat difficult - Very difficult -   GAD 7 : Generalized Anxiety Score 03/19/2016 01/16/2016 12/07/2015 11/22/2015  Nervous, Anxious, on Edge 1 1 3 2   Control/stop worrying 1 1 3 3   Worry too much - different things 1 1 1 1   Trouble relaxing 1 1 2 3   Restless 1 0 0 0  Easily annoyed or irritable 1 1 1 2   Afraid - awful might happen 1 1 3 3   Total GAD 7 Score 7 6 13 14   Anxiety Difficulty Extremely difficult Somewhat difficult Somewhat difficult Very difficult    Lab Results  Component Value Date   TESTOSTERONE 215 (L) 12/07/2015      No results found for this or any previous visit (from the past 72 hour(s)). No results found.    Assessment and Plan: 41 y.o. male with  Anxiety: Not well controlled enough. Patient continues to have significant life altering symptoms. Plan refer to psychiatry for further help at this time.  Diabetes: Not as well-controlled as I would like. Continue Trulicity and start Jardiance. Recheck in a month.  Low testosterone: Symptomatic decreased libido. Start Depakote testosterone today. 200 mg twice weekly. Return in 2 weeks for nurse visit for teaching.   Orders Placed This Encounter  Procedures  . Ambulatory referral to Psychiatry    Referral Priority:   Routine    Referral Type:   Psychiatric    Referral Reason:   Specialty Services Required    Referred to Provider:   Thresa RossNadeem Akhtar, MD    Requested Specialty:   Psychiatry    Number of Visits Requested:   1    Discussed warning signs or symptoms. Please see discharge instructions. Patient expresses understanding.  I spent 40 minutes with this patient, greater than 50% was face-to-face time counseling regarding the above diagnosis.

## 2016-03-19 NOTE — Patient Instructions (Addendum)
Thank you for coming in today. Return in 2 weeks for nurse visit to show you how to use testosterone.  Return with your supplies.  You should hear from psychiatry soon.   Start EminenceJardiance.

## 2016-03-20 DIAGNOSIS — E291 Testicular hypofunction: Secondary | ICD-10-CM | POA: Diagnosis not present

## 2016-03-20 DIAGNOSIS — E349 Endocrine disorder, unspecified: Secondary | ICD-10-CM | POA: Diagnosis not present

## 2016-03-20 MED ORDER — TESTOSTERONE CYPIONATE 200 MG/ML IM SOLN
200.0000 mg | Freq: Once | INTRAMUSCULAR | Status: AC
  Administered 2016-03-20: 200 mg via INTRAMUSCULAR

## 2016-03-20 NOTE — Addendum Note (Signed)
Addended by: Minna AntisBRIGHAM, Arelis Neumeier T on: 03/20/2016 05:12 PM   Modules accepted: Orders

## 2016-03-21 ENCOUNTER — Telehealth: Payer: Self-pay | Admitting: *Deleted

## 2016-03-21 NOTE — Telephone Encounter (Signed)
PA submitted for Jardiance through covermymeds. Patient and pharm notified     ? Your request has been approved  ZOXWRU:[id number];XBJYNWGCaseId:[id number];Product Name:ST: Sodium Glucose Co-Transporter-2 (SGLT-2) Inhibitors (Farxiga, Ferndalenvokana, Knippanvokamet, MabscottJardiance, MizpahSynjardy, Xigduo XR) - ESI;Status:Approved;Coverage Start Date:02/20/2016;Coverage End Date:03/21/2017;

## 2016-04-02 ENCOUNTER — Ambulatory Visit: Payer: BLUE CROSS/BLUE SHIELD

## 2016-04-02 ENCOUNTER — Telehealth: Payer: Self-pay | Admitting: *Deleted

## 2016-04-02 NOTE — Telephone Encounter (Signed)
Outcome  Approvedtoday  CaseId:[id number];Status:Approved;Review Type:Prior Auth;Coverage Start Date:03/03/2016;Coverage End Date:04/02/2017; Key: [id number] (testosterone cypionate)   patient and pharm notified

## 2016-04-11 ENCOUNTER — Other Ambulatory Visit: Payer: Self-pay | Admitting: Family Medicine

## 2016-04-11 DIAGNOSIS — E1165 Type 2 diabetes mellitus with hyperglycemia: Secondary | ICD-10-CM

## 2016-04-11 DIAGNOSIS — E1169 Type 2 diabetes mellitus with other specified complication: Secondary | ICD-10-CM

## 2016-04-11 DIAGNOSIS — R5382 Chronic fatigue, unspecified: Secondary | ICD-10-CM

## 2016-04-11 DIAGNOSIS — R748 Abnormal levels of other serum enzymes: Secondary | ICD-10-CM

## 2016-04-11 DIAGNOSIS — E782 Mixed hyperlipidemia: Secondary | ICD-10-CM

## 2016-04-11 DIAGNOSIS — I10 Essential (primary) hypertension: Secondary | ICD-10-CM

## 2016-04-12 ENCOUNTER — Other Ambulatory Visit: Payer: Self-pay | Admitting: Family Medicine

## 2016-04-13 ENCOUNTER — Ambulatory Visit (INDEPENDENT_AMBULATORY_CARE_PROVIDER_SITE_OTHER): Payer: BLUE CROSS/BLUE SHIELD | Admitting: Family Medicine

## 2016-04-13 VITALS — BP 126/83 | HR 78

## 2016-04-13 DIAGNOSIS — E349 Endocrine disorder, unspecified: Secondary | ICD-10-CM

## 2016-04-13 DIAGNOSIS — R7989 Other specified abnormal findings of blood chemistry: Secondary | ICD-10-CM

## 2016-04-13 NOTE — Progress Notes (Signed)
Instructed pt on administering his own testosterone injection.  Pt injected on his own without complications.  Printed instructions sent home with pt as well.

## 2016-04-25 ENCOUNTER — Ambulatory Visit (INDEPENDENT_AMBULATORY_CARE_PROVIDER_SITE_OTHER): Payer: BLUE CROSS/BLUE SHIELD | Admitting: Psychiatry

## 2016-04-25 ENCOUNTER — Encounter (HOSPITAL_COMMUNITY): Payer: Self-pay | Admitting: Psychiatry

## 2016-04-25 VITALS — BP 130/80 | HR 80 | Resp 16 | Ht 68.0 in | Wt 308.0 lb

## 2016-04-25 DIAGNOSIS — Z813 Family history of other psychoactive substance abuse and dependence: Secondary | ICD-10-CM

## 2016-04-25 DIAGNOSIS — F410 Panic disorder [episodic paroxysmal anxiety] without agoraphobia: Secondary | ICD-10-CM | POA: Diagnosis not present

## 2016-04-25 DIAGNOSIS — F0630 Mood disorder due to known physiological condition, unspecified: Secondary | ICD-10-CM | POA: Diagnosis not present

## 2016-04-25 DIAGNOSIS — F1290 Cannabis use, unspecified, uncomplicated: Secondary | ICD-10-CM

## 2016-04-25 DIAGNOSIS — F4310 Post-traumatic stress disorder, unspecified: Secondary | ICD-10-CM

## 2016-04-25 DIAGNOSIS — Z79899 Other long term (current) drug therapy: Secondary | ICD-10-CM | POA: Diagnosis not present

## 2016-04-25 DIAGNOSIS — Z811 Family history of alcohol abuse and dependence: Secondary | ICD-10-CM | POA: Diagnosis not present

## 2016-04-25 DIAGNOSIS — Z87891 Personal history of nicotine dependence: Secondary | ICD-10-CM | POA: Diagnosis not present

## 2016-04-25 MED ORDER — LAMOTRIGINE 25 MG PO TABS: 25.0000 mg | ORAL_TABLET | Freq: Every day | ORAL | 0 refills | Status: DC

## 2016-04-25 MED ORDER — BUPROPION HCL 100 MG PO TABS: 100.0000 mg | ORAL_TABLET | Freq: Every day | ORAL | 0 refills | Status: DC

## 2016-04-25 NOTE — Patient Instructions (Signed)
Recommend therapy  
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
## 2016-05-01 ENCOUNTER — Other Ambulatory Visit: Payer: Self-pay | Admitting: Family Medicine

## 2016-05-01 ENCOUNTER — Telehealth: Payer: Self-pay

## 2016-05-01 NOTE — Telephone Encounter (Signed)
Russell Ramos needs a refill on clonazepam. He did see Dr Gilmore LarocheAkhtar but will not continue his care with him. He is in the process of getting a different psychiatrist. Please advise.

## 2016-05-02 ENCOUNTER — Other Ambulatory Visit: Payer: Self-pay | Admitting: Family Medicine

## 2016-05-02 NOTE — Telephone Encounter (Signed)
Med refilled today

## 2016-05-02 NOTE — Telephone Encounter (Signed)
Left message advising of refill.  

## 2016-05-17 ENCOUNTER — Ambulatory Visit (HOSPITAL_COMMUNITY): Payer: Self-pay | Admitting: Psychiatry

## 2016-05-21 ENCOUNTER — Ambulatory Visit (HOSPITAL_COMMUNITY): Payer: Self-pay | Admitting: Licensed Clinical Social Worker

## 2016-05-28 ENCOUNTER — Ambulatory Visit (INDEPENDENT_AMBULATORY_CARE_PROVIDER_SITE_OTHER): Payer: BLUE CROSS/BLUE SHIELD

## 2016-05-28 ENCOUNTER — Encounter: Payer: Self-pay | Admitting: Family Medicine

## 2016-05-28 ENCOUNTER — Ambulatory Visit (INDEPENDENT_AMBULATORY_CARE_PROVIDER_SITE_OTHER): Payer: BLUE CROSS/BLUE SHIELD | Admitting: Family Medicine

## 2016-05-28 VITALS — BP 144/79 | HR 75

## 2016-05-28 DIAGNOSIS — M25562 Pain in left knee: Secondary | ICD-10-CM

## 2016-05-28 DIAGNOSIS — S8992XA Unspecified injury of left lower leg, initial encounter: Secondary | ICD-10-CM | POA: Diagnosis not present

## 2016-05-28 DIAGNOSIS — S8992XD Unspecified injury of left lower leg, subsequent encounter: Secondary | ICD-10-CM

## 2016-05-28 DIAGNOSIS — M25561 Pain in right knee: Secondary | ICD-10-CM | POA: Diagnosis not present

## 2016-05-28 DIAGNOSIS — Z87828 Personal history of other (healed) physical injury and trauma: Secondary | ICD-10-CM

## 2016-05-28 DIAGNOSIS — F410 Panic disorder [episodic paroxysmal anxiety] without agoraphobia: Secondary | ICD-10-CM

## 2016-05-28 MED ORDER — HYDROCODONE-ACETAMINOPHEN 5-325 MG PO TABS: 1.0000 | ORAL_TABLET | Freq: Four times a day (QID) | ORAL | 0 refills | Status: DC | PRN

## 2016-05-28 NOTE — Addendum Note (Signed)
Addended by: Rodolph Bong on: 05/28/2016 03:44 PM   Modules accepted: Orders

## 2016-05-28 NOTE — Progress Notes (Addendum)
Russell Ramos is a 41 y.o. male who presents to [facility]: Primary Care Sports Medicine today for new left knee injury and worsening anxiety.   Russell Ramos re-injured his left knee 2 days ago. He was in his normal state of health when he went to sit on his bed. He was crawling into his bed with a flexed knee position and felt a pop. He has developed swelling and pain. He has a pertinent past surgical history for left knee posterior lateral corner injury requiring surgical fixation in 2016. He notes that the pain is quite significant. He is frustrated by his injury. He denies any radiating pain weakness or numbness.    Anxiety. As previously noted patient has a significant medical history concerning for anxiety. He has been reasonably well controlled with intermittent clonazepam and Zoloft. Follow-up appointment with psychiatrist in the interim. He notes that this time the bupropion was discontinued and Lamictal was started. He did not think he had a good interaction with the psychiatrist and does not tolerate Lamictal very well. He has a follow-up appointment with a different psychologist and psychiatrist later this month. He has discontinued both Lamictal and bupropion. He notes with the repeat injury to his left knees anxiety has worsened considerably.   Past Medical History:  Diagnosis Date  . Diabetes mellitus without complication (HCC)   . Hypertension   . Pulmonary embolism (HCC)   . Pulmonary hypertension    Past Surgical History:  Procedure Laterality Date  . KNEE SURGERY Left    Social History  Substance Use Topics  . Smoking status: Former Smoker    Packs/day: 1.00    Types: Cigarettes    Quit date: 09/12/2015  . Smokeless tobacco: Never Used  . Alcohol use Yes     Comment: socially   family history includes Diabetes in his mother.  ROS as above:  Medications: Current Outpatient  Prescriptions  Medication Sig Dispense Refill  . AMBULATORY NON FORMULARY MEDICATION Single glucometer with lancets, test strips. Use glucometer of choice Test daily.  Uncontrolled Diabetes E11.9 1 each 0  . AMBULATORY NON FORMULARY MEDICATION Continuous positive airway pressure (CPAP) machine auto titrate to a max pressure of 15 cm of H2O pressure, with all supplemental supplies as needed. AHI 13.3 1 each 0  . AMBULATORY NON FORMULARY MEDICATION 3ml syringe, 21g 1.5 inch needles and 18 gauge blunt filler needles for testosterone injections every 2 weeks. Disp qs x 3 months. 1 each 0  . atorvastatin (LIPITOR) 40 MG tablet TAKE 1 TABLET (40 MG TOTAL) BY MOUTH DAILY. 90 tablet 1  . clonazePAM (KLONOPIN) 0.5 MG tablet TAKE 1 TABLET BY MOUTH TWICE A DAY 60 tablet 0  . Dulaglutide (TRULICITY) 1.5 MG/0.5ML SOPN Inject 1.5 mg into the skin once a week. 4 pen 12  . empagliflozin (JARDIANCE) 25 MG TABS tablet Take 25 mg by mouth daily. 30 tablet 2  . lisinopril (PRINIVIL,ZESTRIL) 5 MG tablet TAKE 1 TABLET (5 MG TOTAL) BY MOUTH DAILY. 90 tablet 1  . omeprazole (PRILOSEC) 40 MG capsule TAKE 1 CAPSULE (40 MG TOTAL) BY MOUTH DAILY. 90 capsule 0  . sertraline (ZOLOFT) 100 MG tablet Take 1 tablet (100 mg total) by mouth daily. 90 tablet 1  . testosterone cypionate (DEPOTESTOSTERONE CYPIONATE) 200 MG/ML injection Inject 1 mL (200 mg total) into the muscle every 14 (fourteen) days. 10 mL 0   No current facility-administered medications for this visit.    No Known  Allergies  Health Maintenance Health Maintenance  Topic Date Due  . OPHTHALMOLOGY EXAM  07/20/1987  . HEMOGLOBIN A1C  07/15/2016  . FOOT EXAM  09/15/2016  . INFLUENZA VACCINE  09/19/2016  . PNEUMOCOCCAL POLYSACCHARIDE VACCINE (2) 09/15/2020  . TETANUS/TDAP  09/15/2025  . HIV Screening  Completed     Exam:  BP (!) 144/79   Pulse 75  Gen: Well NAD HEENT: EOMI,  MMM Lungs: Normal work of breathing. CTABL Heart: RRR no MRG Abd: NABS,  Soft. Nondistended, Nontender Exts: Brisk capillary refill, warm and well perfused.  Left knee large effusion. Tender palpation diffusely. Range of motion 10-100. No crepitations present. Patient guards with exam. Moderately positive McMurray's test. Psych: Alert and oriented normal affect speech and thought process. Depression screen [facility] 2/9 05/28/2016 03/19/2016 01/16/2016 12/07/2015 11/22/2015  Decreased Interest 0 1 0 0 1  Down, Depressed, Hopeless 1 0 0 1 2  PHQ - 2 Score 1 1 0 1 3  Altered sleeping Tired, decreased energy Change in appetite 0 0  Feeling bad or failure about yourself  Trouble concentrating 1 0 Moving slowly or fidgety/restless 0 0 0 0 0  Suicidal thoughts 0 0 0 0 -  PHQ-9 Score Difficult doing work/chores - - Somewhat difficult - Very difficult   GAD 7 : Generalized Anxiety Score 05/28/2016 03/19/2016 01/16/2016 12/07/2015  Nervous, Anxious, on Edge Control/stop worrying Worry too much - different things Trouble relaxing Restless 1 1 0 0  Easily annoyed or irritable Afraid - awful might happen Total GAD 7 Score Anxiety Difficulty Somewhat difficult Extremely difficult Somewhat difficult Somewhat difficult        No results found for this or any previous visit (from the past 72 hour(s)). Dg Knee 1-2 Views Right  Result Date: 05/28/2016 CLINICAL DATA:  Bilateral knee pain. EXAM: RIGHT KNEE - 1-2 VIEW; LEFT KNEE - COMPLETE 4+ VIEW COMPARISON:  None. FINDINGS: Bones:  No fracture or dislocation.  Normal bone mineralization. Joints: Normal alignment. No erosive changes. Joint spaces are relatively well maintained. Small left knee joint effusion. Soft tissue: No soft tissue abnormality. No radiopaque foreign body. No subcutaneous emphysema. IMPRESSION: 1. No acute osseous injury of the right knee. 2. No acute osseous injury of the left knee. 3. No  significant arthropathy of bilateral knees. Electronically Signed   By: Elige Ko   On: 05/28/2016 09:25   Dg Knee Complete 4 Views Left  Result Date: 05/28/2016 CLINICAL DATA:  Bilateral knee pain. EXAM: RIGHT KNEE - 1-2 VIEW; LEFT KNEE - COMPLETE 4+ VIEW COMPARISON:  None. FINDINGS: Bones:  No fracture or dislocation.  Normal bone mineralization. Joints: Normal alignment. No erosive changes. Joint spaces are relatively well maintained. Small left knee joint effusion. Soft tissue: No soft tissue abnormality. No radiopaque foreign body. No subcutaneous emphysema. IMPRESSION: 1. No acute osseous injury of the right knee. 2. No acute osseous injury of the left knee. 3. No significant arthropathy of bilateral knees. Electronically Signed   By: Elige Ko   On: 05/28/2016 09:25      Assessment and Plan: 41 y.o. male with  Left  knee repeat injury with effusion concerning for meniscus injury. X-ray obtained today and is relatively unremarkable. MRI is pending. Recommend knee brace and weightbearing as tolerated. Follow-up after MRI. 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 I recommend taking aspirin and ambulating as much as possible to prevent repeat DVT. Will treat pain with norco.   Anxiety obviously worsen. Follow up with psychiatry. Continue Zoloft and Klonopin   Orders Placed This Encounter  Procedures  . DG Knee 1-2 Views Right    Standing Status:   Future    Number of Occurrences:   1    Standing Expiration Date:   07/29/2017    Order Specific Question:   Reason for Exam (SYMPTOM  OR DIAGNOSIS REQUIRED)    Answer:   For use with the left knee x-ray bilateral AP and Rosenberg standing.    Order Specific Question:   Preferred imaging location?    Answer:   [facility]  . DG Knee Complete 4 Views Left    Please include patellar sunrise, lateral, and weightbearing bilateral AP and bilateral rosenberg views    Standing Status:   Future    Number of  Occurrences:   1    Standing Expiration Date:   07/28/2017    Order Specific Question:   Reason for exam:    Answer:   Please include patellar sunrise, lateral, and weightbearing bilateral AP and bilateral rosenberg views    Comments:   Please include patellar sunrise, lateral, and weightbearing bilateral AP and bilateral rosenberg views    Order Specific Question:   Preferred imaging location?    Answer:   [facility]  . MR Knee Left  Wo Contrast    Standing Status:   Future    Standing Expiration Date:   07/28/2017    Order Specific Question:   Reason for Exam (SYMPTOM  OR DIAGNOSIS REQUIRED)    Answer:   eval knee injury suspect meniscus injury    Order Specific Question:   What is the patient's sedation requirement?    Answer:   No Sedation    Order Specific Question:   Does the patient have a pacemaker or implanted devices?    Answer:   No    Order Specific Question:   Preferred imaging location?    Answer:   [facility] (table limit-350lbs)    Order Specific Question:   Radiology Contrast Protocol - do NOT remove file path    Answer:   [email or web address]   No orders of the defined types were placed in this encounter.    Discussed warning signs or symptoms. Please see discharge instructions. Patient expresses understanding.

## 2016-05-28 NOTE — Patient Instructions (Signed)
Thank you for coming in today. You should have an MRI today or next Monday.  Follow up with me 1-2 days after the MRI.  Use the hinged knee brace and crutches as needed.  Take 1 adult aspirin daily.  Try to keep active.

## 2016-05-31 ENCOUNTER — Telehealth: Payer: Self-pay | Admitting: Family Medicine

## 2016-05-31 NOTE — Telephone Encounter (Signed)
Pt advised of results, verbalized understanding and appt scheduled. No further questions.

## 2016-06-01 ENCOUNTER — Ambulatory Visit (INDEPENDENT_AMBULATORY_CARE_PROVIDER_SITE_OTHER): Payer: BLUE CROSS/BLUE SHIELD | Admitting: Family Medicine

## 2016-06-01 ENCOUNTER — Encounter: Payer: Self-pay | Admitting: Family Medicine

## 2016-06-01 VITALS — BP 134/62 | HR 74 | Wt 309.0 lb

## 2016-06-01 DIAGNOSIS — M25562 Pain in left knee: Secondary | ICD-10-CM | POA: Diagnosis not present

## 2016-06-01 DIAGNOSIS — S8992XD Unspecified injury of left lower leg, subsequent encounter: Secondary | ICD-10-CM

## 2016-06-01 NOTE — Patient Instructions (Signed)
Thank you for coming in today. I recommend doing an exercise bike 20-30 minutes most of the days a week.  Return if the knee pain does not improve.  We can do an injection as needed.    Baker Cyst A Baker cyst, also called a popliteal cyst, is a sac-like growth that forms at the back of the knee. The cyst forms when the fluid-filled sac (bursa) that cushions the knee joint becomes enlarged. The bursa that becomes a Baker cyst is located at the back of the knee joint. What are the causes? In most cases, a Baker cyst results from another knee problem that causes swelling inside the knee. This makes the fluid inside the knee joint (synovial fluid) flow into the bursa behind the knee, causing the bursa to enlarge. What increases the risk? You may be more likely to develop a Baker cyst if you already have a knee problem, such as:  A tear in cartilage that cushions the knee joint (meniscal tear).  A tear in the tissues that connect the bones of the knee joint (ligament tear).  Knee swelling from osteoarthritis, rheumatoid arthritis, or gout. What are the signs or symptoms? A Baker cyst does not always cause symptoms. A lump behind the knee may be the only sign of the condition. The lump may be painful, especially when the knee is straightened. If the lump is painful, the pain may come and go. The knee may also be stiff. Symptoms may quickly get more severe if the cyst breaks open (ruptures). If your cyst ruptures, signs and symptoms may affect the knee and the back of the lower leg (calf) and may include:  Sudden or worsening pain.  Swelling.  Bruising. How is this diagnosed? This condition may be diagnosed based on your symptoms and medical history. Your health care provider will also do a physical exam. This may include:  Feeling the cyst to check whether it is tender.  Checking your knee for signs of another knee condition that causes swelling. You may have imaging tests, such  as:  X-rays.  MRI.  Ultrasound. How is this treated? A Baker cyst that is not painful may go away without treatment. If the cyst gets large or painful, it will likely get better if the underlying knee problem is treated. Treatment for a Baker cyst may include:  Resting.  Keeping weight off of the knee. This means not leaning on the knee to support your body weight.  NSAIDs to reduce pain and swelling.  A procedure to drain the fluid from the cyst with a needle (aspiration). You may also get an injection of a medicine that reduces swelling (steroid).  Surgery. This may be needed if other treatments do not work. This usually involves correcting knee damage and removing the cyst. Follow these instructions at home:  Take over-the-counter and prescription medicines only as told by your health care provider.  Rest and return to your normal activities as told by your health care provider. Avoid activities that make pain or swelling worse. Ask your health care provider what activities are safe for you.  Keep all follow-up visits as told by your health care provider. This is important. Contact a health care provider if:  You have knee pain, stiffness, or swelling that does not get better. Get help right away if:  You have sudden or worsening pain and swelling in your calf area. This information is not intended to replace advice given to you by your health care provider.  Make sure you discuss any questions you have with your health care provider. Document Released: 02/05/2005 Document Revised: 10/27/2015 Document Reviewed: 10/27/2015 Elsevier Interactive Patient Education  2017 [employer].

## 2016-06-01 NOTE — Progress Notes (Signed)
Russell Ramos is a 41 y.o. male who presents to [name]: Primary Care Sports Medicine today for follow-up knee pain and MRI. Patient was seen earlier this week with significant left knee pain. This is associated with effusion. An MRI that did not show any acute injury. In the interim he notes the pain is improving.   Past Medical History:  Diagnosis Date  . Diabetes mellitus without complication (HCC)   . Hypertension   . Pulmonary embolism (HCC)   . Pulmonary hypertension    Past Surgical History:  Procedure Laterality Date  . KNEE SURGERY Left    Social History  Substance Use Topics  . Smoking status: Former Smoker    Packs/day: 1.00    Types: Cigarettes    Quit date: 09/12/2015  . Smokeless tobacco: Never Used  . Alcohol use Yes     Comment: socially   family history includes Diabetes in his mother.  ROS as above:  Medications: Current Outpatient Prescriptions  Medication Sig Dispense Refill  . AMBULATORY NON FORMULARY MEDICATION Single glucometer with lancets, test strips. Use glucometer of choice Test daily.  Uncontrolled Diabetes E11.9 1 each 0  . AMBULATORY NON FORMULARY MEDICATION Continuous positive airway pressure (CPAP) machine auto titrate to a max pressure of 15 cm of H2O pressure, with all supplemental supplies as needed. AHI 13.3 1 each 0  . AMBULATORY NON FORMULARY MEDICATION 3ml syringe, 21g 1.5 inch needles and 18 gauge blunt filler needles for testosterone injections every 2 weeks. Disp qs x 3 months. 1 each 0  . atorvastatin (LIPITOR) 40 MG tablet TAKE 1 TABLET (40 MG TOTAL) BY MOUTH DAILY. 90 tablet 1  . clonazePAM (KLONOPIN) 0.5 MG tablet TAKE 1 TABLET BY MOUTH TWICE A DAY 60 tablet 0  . Dulaglutide (TRULICITY) 1.5 MG/0.5ML SOPN Inject 1.5 mg into the skin once a week. 4 pen 12  . empagliflozin (JARDIANCE) 25 MG TABS tablet Take 25 mg by mouth daily. 30  tablet 2  . HYDROcodone-acetaminophen (NORCO/VICODIN) 5-325 MG tablet Take 1 tablet by mouth every 6 (six) hours as needed. 15 tablet 0  . lisinopril (PRINIVIL,ZESTRIL) 5 MG tablet TAKE 1 TABLET (5 MG TOTAL) BY MOUTH DAILY. 90 tablet 1  . omeprazole (PRILOSEC) 40 MG capsule TAKE 1 CAPSULE (40 MG TOTAL) BY MOUTH DAILY. 90 capsule 0  . sertraline (ZOLOFT) 100 MG tablet Take 1 tablet (100 mg total) by mouth daily. 90 tablet 1  . testosterone cypionate (DEPOTESTOSTERONE CYPIONATE) 200 MG/ML injection Inject 1 mL (200 mg total) into the muscle every 14 (fourteen) days. 10 mL 0   No current facility-administered medications for this visit.    No Known Allergies  Health Maintenance Health Maintenance  Topic Date Due  . OPHTHALMOLOGY EXAM  07/20/1987  . HEMOGLOBIN A1C  07/15/2016  . FOOT EXAM  09/15/2016  . INFLUENZA VACCINE  09/19/2016  . PNEUMOCOCCAL POLYSACCHARIDE VACCINE (2) 09/15/2020  . TETANUS/TDAP  09/15/2025  . HIV Screening  Completed     Exam:  BP 134/62   Pulse 74   Wt (!) 309 lb (140.2 kg)   BMI 46.98 kg/m   Wt Readings from Last 10 Encounters:  06/01/16 (!) 309 lb (140.2 kg)  03/19/16 (!) 316 lb (143.3 kg)  12/07/15 294 lb (133.4 kg)  11/22/15 292 lb (132.5 kg)  10/27/15 285 lb (129.3 kg)  10/13/15 288 lb 6.4 oz (130.8 kg)  09/29/15 287 lb (130.2 kg)  09/16/15 289 lb (131.1  kg)  09/14/15 288 lb (130.6 kg)    Left knee moderate effusion nontender normal motion. Crepitations on extension.   No results found for this or any previous visit (from the past 72 hour(s)). Mr Knee Left  Wo Contrast  Result Date: 05/28/2016 CLINICAL DATA:  The patient felt a pop in the left knee 2 days ago when getting into bed with onset of pain. History of prior posterolateral corner injury requiring surgical fixation in 2016. EXAM: MRI OF THE LEFT KNEE WITHOUT CONTRAST TECHNIQUE: Multiplanar, multisequence MR imaging of the knee was performed. No intravenous contrast was administered.  COMPARISON:  Plain films of the left knee 05/28/2016. FINDINGS: MENISCI Medial meniscus: Degenerative signal is seen in the posterior horn but no tear is identified. Intact. Lateral meniscus:  Intact. LIGAMENTS Cruciates:  Intact. Collaterals: The medial collateral ligament appears normal. The patient is status post fibular collateral ligament repair. The ligament is intact. CARTILAGE Patellofemoral:  Negative. Medial:  Minimally degenerated. Lateral:  Negative. Joint:  Moderate effusion. Popliteal Fossa:  No Baker's cyst. Extensor Mechanism: Postoperative change of lateral release is noted. No acute injury. Bones:  No fracture or worrisome lesion. Other: None. IMPRESSION: No acute abnormality. Status post posterolateral corner injury repair. The repair appears intact and no complicating feature is identified. Electronically Signed   By: Drusilla Kanner M.D.   On: 05/28/2016 15:39      Assessment and Plan: 41 y.o. male with left knee pain with effusion. Likely patient did tear a scar tissue around the posterior lateral corner or some other similar things on visible on MRI. He certainly seems to be doing better. We'll hold off on injection today. Plan for watchful waiting. Work on quadriceps strength and weight loss.   No orders of the defined types were placed in this encounter.  No orders of the defined types were placed in this encounter.    Discussed warning signs or symptoms. Please see discharge instructions. Patient expresses understanding.

## 2016-06-05 ENCOUNTER — Other Ambulatory Visit: Payer: Self-pay | Admitting: Family Medicine

## 2016-06-23 ENCOUNTER — Other Ambulatory Visit: Payer: Self-pay | Admitting: Family Medicine

## 2016-06-23 DIAGNOSIS — E1165 Type 2 diabetes mellitus with hyperglycemia: Secondary | ICD-10-CM

## 2016-07-05 ENCOUNTER — Other Ambulatory Visit: Payer: Self-pay | Admitting: Family Medicine

## 2016-07-25 ENCOUNTER — Other Ambulatory Visit: Payer: Self-pay | Admitting: Family Medicine

## 2016-07-25 DIAGNOSIS — E1165 Type 2 diabetes mellitus with hyperglycemia: Secondary | ICD-10-CM

## 2016-08-25 ENCOUNTER — Other Ambulatory Visit: Payer: Self-pay | Admitting: Family Medicine

## 2016-08-25 DIAGNOSIS — E1165 Type 2 diabetes mellitus with hyperglycemia: Secondary | ICD-10-CM

## 2016-08-29 ENCOUNTER — Encounter: Payer: Self-pay | Admitting: Family Medicine

## 2016-08-29 ENCOUNTER — Ambulatory Visit (INDEPENDENT_AMBULATORY_CARE_PROVIDER_SITE_OTHER): Payer: BLUE CROSS/BLUE SHIELD | Admitting: Family Medicine

## 2016-08-29 VITALS — BP 125/72 | HR 84 | Wt 303.0 lb

## 2016-08-29 DIAGNOSIS — E559 Vitamin D deficiency, unspecified: Secondary | ICD-10-CM

## 2016-08-29 DIAGNOSIS — E1165 Type 2 diabetes mellitus with hyperglycemia: Secondary | ICD-10-CM | POA: Diagnosis not present

## 2016-08-29 DIAGNOSIS — E782 Mixed hyperlipidemia: Secondary | ICD-10-CM

## 2016-08-29 DIAGNOSIS — R7989 Other specified abnormal findings of blood chemistry: Secondary | ICD-10-CM | POA: Diagnosis not present

## 2016-08-29 DIAGNOSIS — I10 Essential (primary) hypertension: Secondary | ICD-10-CM

## 2016-08-29 DIAGNOSIS — E1169 Type 2 diabetes mellitus with other specified complication: Secondary | ICD-10-CM | POA: Diagnosis not present

## 2016-08-29 DIAGNOSIS — R748 Abnormal levels of other serum enzymes: Secondary | ICD-10-CM | POA: Diagnosis not present

## 2016-08-29 DIAGNOSIS — R5382 Chronic fatigue, unspecified: Secondary | ICD-10-CM

## 2016-08-29 DIAGNOSIS — I272 Pulmonary hypertension, unspecified: Secondary | ICD-10-CM | POA: Diagnosis not present

## 2016-08-29 LAB — POCT GLYCOSYLATED HEMOGLOBIN (HGB A1C): HEMOGLOBIN A1C: 6.9

## 2016-08-29 MED ORDER — CLONAZEPAM 0.5 MG PO TABS: 0.5000 mg | ORAL_TABLET | Freq: Two times a day (BID) | ORAL | 5 refills | Status: DC

## 2016-08-29 MED ORDER — EMPAGLIFLOZIN 25 MG PO TABS: 25.0000 mg | ORAL_TABLET | Freq: Every day | ORAL | 3 refills | Status: DC

## 2016-08-29 MED ORDER — ATORVASTATIN CALCIUM 40 MG PO TABS: 40.0000 mg | ORAL_TABLET | Freq: Every day | ORAL | 3 refills | Status: DC

## 2016-08-29 NOTE — Progress Notes (Signed)
Russell Ramos is a 41 y.o. male who presents to [name]: Primary Care Sports Medicine today for diabetes hypertension anxiety and obesity.  Diabetes: Patient takes medications listed below. He notes his blood sugars are typically in the 120s. He is eating a very low carbohydrate diet. He thinks he eats less than 50 g of carbs per day. He feels well with no polyuria and polydipsia.  Hypertension: Patient takes medications listed below with no chest pain palpitations or shortness of breath.  Anxiety: Patient has a history of anxiety disorder with panic attacks. He notes he is doing quite well with medications listed below. He continues Klonopin but thinks he's possibly ready for taper. He was recently started on propranolol for panic which has helped as well.  Obesity: Patient continues to experience obesity. With the reduced carbohydrate diet he notes that he has lost weight and feels better. He is somewhat interested in the possibility of a gastric bypass surgery.   Past Medical History:  Diagnosis Date  . Diabetes mellitus without complication (HCC)   . Hypertension   . Pulmonary embolism (HCC)   . Pulmonary hypertension (HCC)    Past Surgical History:  Procedure Laterality Date  . KNEE SURGERY Left    Social History  Substance Use Topics  . Smoking status: Former Smoker    Packs/day: 1.00    Types: Cigarettes    Quit date: 09/12/2015  . Smokeless tobacco: Never Used  . Alcohol use Yes     Comment: socially   family history includes Diabetes in his mother.  ROS as above:  Medications: Current Outpatient Prescriptions  Medication Sig Dispense Refill  . AMBULATORY NON FORMULARY MEDICATION Single glucometer with lancets, test strips. Use glucometer of choice Test daily.  Uncontrolled Diabetes E11.9 1 each 0  . AMBULATORY NON FORMULARY MEDICATION Continuous positive airway  pressure (CPAP) machine auto titrate to a max pressure of 15 cm of H2O pressure, with all supplemental supplies as needed. AHI 13.3 1 each 0  . AMBULATORY NON FORMULARY MEDICATION 3ml syringe, 21g 1.5 inch needles and 18 gauge blunt filler needles for testosterone injections every 2 weeks. Disp qs x 3 months. 1 each 0  . atorvastatin (LIPITOR) 40 MG tablet Take 1 tablet (40 mg total) by mouth daily. 90 tablet 3  . clonazePAM (KLONOPIN) 0.5 MG tablet Take 1 tablet (0.5 mg total) by mouth 2 (two) times daily. 60 tablet 5  . Dulaglutide (TRULICITY) 1.5 MG/0.5ML SOPN Inject 1.5 mg into the skin once a week. 4 pen 12  . empagliflozin (JARDIANCE) 25 MG TABS tablet Take 25 mg by mouth daily. 90 tablet 3  . lisinopril (PRINIVIL,ZESTRIL) 5 MG tablet TAKE 1 TABLET (5 MG TOTAL) BY MOUTH DAILY. 90 tablet 1  . omeprazole (PRILOSEC) 40 MG capsule TAKE 1 CAPSULE (40 MG TOTAL) BY MOUTH DAILY. 90 capsule 3  . SAFETY-LOK 3CC SYR 22GX1.5" 22G X 1-1/2" 3 ML MISC TO BE USED TO DRAW TESTOSTERONE FOR INJECTION 6 each 0  . sertraline (ZOLOFT) 100 MG tablet TAKE 1 TABLET (100 MG TOTAL) BY MOUTH DAILY. (Patient taking differently: Take 150 mg by mouth daily. ) 90 tablet 1  . testosterone cypionate (DEPOTESTOSTERONE CYPIONATE) 200 MG/ML injection Inject 1 mL (200 mg total) into the muscle every 14 (fourteen) days. 10 mL 0  . propranolol (INDERAL) 20 MG tablet Take 20 mg by mouth every 6 (six) hours as needed. for anxiety  2   No  current facility-administered medications for this visit.    No Known Allergies  Health Maintenance Health Maintenance  Topic Date Due  . OPHTHALMOLOGY EXAM  07/20/1987  . HEMOGLOBIN A1C  07/15/2016  . FOOT EXAM  09/15/2016  . INFLUENZA VACCINE  09/19/2016  . PNEUMOCOCCAL POLYSACCHARIDE VACCINE (2) 09/15/2020  . TETANUS/TDAP  09/15/2025  . HIV Screening  Completed     Exam:  BP 125/72   Pulse 84   Wt (!) 303 lb (137.4 kg)   SpO2 96%   BMI 46.07 kg/m   Wt Readings from Last 10  Encounters:  08/29/16 (!) 303 lb (137.4 kg)  06/01/16 (!) 309 lb (140.2 kg)  03/19/16 (!) 316 lb (143.3 kg)  12/07/15 294 lb (133.4 kg)  11/22/15 292 lb (132.5 kg)  10/27/15 285 lb (129.3 kg)  10/13/15 288 lb 6.4 oz (130.8 kg)  09/29/15 287 lb (130.2 kg)  09/16/15 289 lb (131.1 kg)  09/14/15 288 lb (130.6 kg)    Gen: Well NAD HEENT: EOMI,  MMM Lungs: Normal work of breathing. CTABL Heart: RRR no MRG Abd: NABS, Soft. Nondistended, Nontender Exts: Brisk capillary refill, warm and well perfused.   GAD 7 : Generalized Anxiety Score 08/29/2016 05/28/2016 03/19/2016 01/16/2016  Nervous, Anxious, on Edge 1 2 1 1   Control/stop worrying 0 1 1 1   Worry too much - different things 0 1 1 1   Trouble relaxing 0 1 1 1   Restless 0 1 1 0  Easily annoyed or irritable 0 1 1 1   Afraid - awful might happen 1 1 1 1   Total GAD 7 Score 2 8 7 6   Anxiety Difficulty Not difficult at all Somewhat difficult Extremely difficult Somewhat difficult    Depression screen [facility] 2/9 08/29/2016 05/28/2016 03/19/2016 01/16/2016 12/07/2015  Decreased Interest 0 0 1 0 0  Down, Depressed, Hopeless 0 1 0 0 1  PHQ - 2 Score 0 1 1 0 1  Altered sleeping 0 1 1 3 2   Tired, decreased energy 0 2 3 3 3   Change in appetite 0 1 1 1  0  Feeling bad or failure about yourself  0 1 1 1 1   Trouble concentrating 0 1 0 2 1  Moving slowly or fidgety/restless 0 0 0 0 0  Suicidal thoughts 0 0 0 0 0  PHQ-9 Score 0 7 7 10 8   Difficult doing work/chores - - - Somewhat difficult -     Results for orders placed or performed in visit on 08/29/16 (from the past 72 hour(s))  POCT HgB A1C     Status: Abnormal   Collection Time: 08/29/16  3:35 PM  Result Value Ref Range   Hemoglobin A1C 6.9     Comment: %   No results found.    Assessment and Plan: 41 y.o. male with  Obese: Doing quite well A1c at goal. Continue current regimen and follow-up in 3 months.  Hypertension doing well considering current medications. Check basic fasting labs  in the near future.  Anxiety: Doing quite well. Plan to start tapering Klonopin. Recheck in 3 months.  Obesity: Improving today. Continue low carbohydrate diet. Insulin or referral to bariatric surgery.  Check fasting labs.   Orders Placed This Encounter  Procedures  . CBC  . COMPLETE METABOLIC PANEL WITH GFR  . Lipid Panel w/reflex Direct LDL  . Testosterone  . PSA  . VITAMIN D 25 Hydroxy (Vit-D Deficiency, Fractures)  . POCT HgB A1C   Meds ordered this encounter  Medications  .  propranolol (INDERAL) 20 MG tablet    Sig: Take 20 mg by mouth every 6 (six) hours as needed. for anxiety    Refill:  2  . DISCONTD: JANUVIA 100 MG tablet  . empagliflozin (JARDIANCE) 25 MG TABS tablet    Sig: Take 25 mg by mouth daily.    Dispense:  90 tablet    Refill:  3  . clonazePAM (KLONOPIN) 0.5 MG tablet    Sig: Take 1 tablet (0.5 mg total) by mouth 2 (two) times daily.    Dispense:  60 tablet    Refill:  5  . atorvastatin (LIPITOR) 40 MG tablet    Sig: Take 1 tablet (40 mg total) by mouth daily.    Dispense:  90 tablet    Refill:  3     Discussed warning signs or symptoms. Please see discharge instructions. Patient expresses understanding.

## 2016-08-29 NOTE — Patient Instructions (Signed)
Thank you for coming in today. Continue medicine.  STOP Januvia if you are taking it.  Continue Jardiance.  Recheck in 3 months.  Continue low carb diet.   Let me know if you want to think about Surgical Weight Loss.   Get fasting labs soon.

## 2016-09-06 ENCOUNTER — Other Ambulatory Visit: Payer: Self-pay | Admitting: Family Medicine

## 2016-09-06 DIAGNOSIS — R5382 Chronic fatigue, unspecified: Secondary | ICD-10-CM

## 2016-09-06 DIAGNOSIS — I10 Essential (primary) hypertension: Secondary | ICD-10-CM

## 2016-09-06 DIAGNOSIS — R748 Abnormal levels of other serum enzymes: Secondary | ICD-10-CM

## 2016-09-06 DIAGNOSIS — E1169 Type 2 diabetes mellitus with other specified complication: Secondary | ICD-10-CM

## 2016-09-06 DIAGNOSIS — E782 Mixed hyperlipidemia: Secondary | ICD-10-CM

## 2016-09-06 DIAGNOSIS — E1165 Type 2 diabetes mellitus with hyperglycemia: Secondary | ICD-10-CM

## 2016-10-22 ENCOUNTER — Other Ambulatory Visit: Payer: Self-pay | Admitting: Family Medicine

## 2016-10-22 DIAGNOSIS — R5382 Chronic fatigue, unspecified: Secondary | ICD-10-CM

## 2016-10-22 DIAGNOSIS — E1165 Type 2 diabetes mellitus with hyperglycemia: Secondary | ICD-10-CM

## 2016-10-22 DIAGNOSIS — R748 Abnormal levels of other serum enzymes: Secondary | ICD-10-CM

## 2016-10-22 DIAGNOSIS — E782 Mixed hyperlipidemia: Secondary | ICD-10-CM

## 2016-10-22 DIAGNOSIS — E1169 Type 2 diabetes mellitus with other specified complication: Secondary | ICD-10-CM

## 2016-10-22 DIAGNOSIS — I10 Essential (primary) hypertension: Secondary | ICD-10-CM

## 2016-10-28 ENCOUNTER — Emergency Department (INDEPENDENT_AMBULATORY_CARE_PROVIDER_SITE_OTHER)
Admission: EM | Admit: 2016-10-28 | Discharge: 2016-10-28 | Disposition: A | Discharge status: Home / Self Care | Payer: BLUE CROSS/BLUE SHIELD | Source: Home / Self Care | Attending: Family Medicine | Admitting: Family Medicine

## 2016-10-28 ENCOUNTER — Emergency Department (INDEPENDENT_AMBULATORY_CARE_PROVIDER_SITE_OTHER): Payer: BLUE CROSS/BLUE SHIELD

## 2016-10-28 ENCOUNTER — Encounter: Payer: Self-pay | Admitting: Emergency Medicine

## 2016-10-28 DIAGNOSIS — M19071 Primary osteoarthritis, right ankle and foot: Secondary | ICD-10-CM

## 2016-10-28 DIAGNOSIS — M19079 Primary osteoarthritis, unspecified ankle and foot: Secondary | ICD-10-CM

## 2016-10-28 NOTE — ED Provider Notes (Signed)
Russell Ramos [facility]    CSN: [id number] Arrival date & time: 10/28/16  1556     History   Chief Complaint Chief Complaint  Patient presents with  . Toe Pain    HPI Russell Ramos is a 41 y.o. male.   HPI Russell Ramos is a 41 y.o. male presenting to UC with c/o Right great toe pain that started yesterday. Pain is 8/10, worse today at work as pt has to do a lot of walking and standing. Hx of initially believed gout, but was found to have a small fracture along the way w/o known injury.  Pain feels similar to last time he had a toe fracture. He has tried Tylenol and rest but no relief.  Pt wants to make sure it's not broken again.   Past Medical History:  Diagnosis Date  . Diabetes mellitus without complication (HCC)   . Hypertension   . Pulmonary embolism (HCC)   . Pulmonary hypertension [facility])     Patient Active Problem List   Diagnosis Date Noted  . Left knee injury 05/28/2016  . Panic disorder 11/22/2015  . Dyspepsia 10/27/2015  . Hyperlipidemia 10/13/2015  . Low testosterone 09/23/2015  . Vitamin D deficiency 09/23/2015  . Metformin adverse reaction 09/16/2015  . Fatigue 09/16/2015  . OSA (obstructive sleep apnea) 09/16/2015  . Pulmonary hypertension (HCC) 08/27/2014  . Low serum HDL 08/04/2014  . Cigarette nicotine dependence without complication 08/02/2014  . Mixed diabetic hyperlipidemia associated with type 2 diabetes mellitus (HCC) 08/02/2014  . Posterolateral complex injury 07/11/2014  . DM (diabetes mellitus) (HCC) 02/20/2013  . HTN (hypertension) 02/20/2013  . Morbid obesity (HCC) 02/20/2013    Past Surgical History:  Procedure Laterality Date  . KNEE SURGERY Left        Home Medications    Prior to Admission medications   Medication Sig Start Date End Date Taking? Authorizing Provider  AMBULATORY NON FORMULARY MEDICATION Single glucometer with lancets, test strips. Use glucometer of choice Test daily.  Uncontrolled Diabetes E11.9  09/16/15   Rodolph Bong, MD  AMBULATORY NON FORMULARY MEDICATION Continuous positive airway pressure (CPAP) machine auto titrate to a max pressure of 15 cm of H2O pressure, with all supplemental supplies as needed. AHI 13.3 10/07/15   Rodolph Bong, MD  AMBULATORY NON FORMULARY MEDICATION 3ml syringe, 21g 1.5 inch needles and 18 gauge blunt filler needles for testosterone injections every 2 weeks. Disp qs x 3 months. 03/19/16   Rodolph Bong, MD  atorvastatin (LIPITOR) 40 MG tablet Take 1 tablet (40 mg total) by mouth daily. 08/29/16   Rodolph Bong, MD  clonazePAM (KLONOPIN) 0.5 MG tablet Take 1 tablet (0.5 mg total) by mouth 2 (two) times daily. 08/29/16   Rodolph Bong, MD  empagliflozin (JARDIANCE) 25 MG TABS tablet Take 25 mg by mouth daily. 08/29/16   Rodolph Bong, MD  lisinopril (PRINIVIL,ZESTRIL) 5 MG tablet TAKE 1 TABLET (5 MG TOTAL) BY MOUTH DAILY. 09/06/16   Rodolph Bong, MD  omeprazole (PRILOSEC) 40 MG capsule TAKE 1 CAPSULE (40 MG TOTAL) BY MOUTH DAILY. 06/06/16   Rodolph Bong, MD  propranolol (INDERAL) 20 MG tablet Take 20 mg by mouth every 6 (six) hours as needed. for anxiety 08/16/16   [provider]  SAFETY-LOK 3CC SYR 22GX1.5" 22G X 1-1/2" 3 ML MISC TO BE USED TO DRAW TESTOSTERONE FOR INJECTION 07/05/16   Rodolph Bong, MD  sertraline (ZOLOFT) 100 MG tablet TAKE 1  TABLET (100 MG TOTAL) BY MOUTH DAILY. Patient taking differently: Take 150 mg by mouth daily.  06/06/16   Rodolph Bong, MD  testosterone cypionate (DEPOTESTOSTERONE CYPIONATE) 200 MG/ML injection Inject 1 mL (200 mg total) into the muscle every 14 (fourteen) days. 03/19/16   Rodolph Bong, MD  TRULICITY 1.5 MG/0.5ML SOPN INJECT 1.5 MG INTO THE SKIN ONCE A WEEK. 10/23/16   Rodolph Bong, MD    Family History Family History  Problem Relation Age of Onset  . Diabetes Mother     Social History Social History  Substance Use Topics  . Smoking status: Former Smoker    Packs/day: 1.00    Types: Cigarettes    Quit  date: 09/12/2015  . Smokeless tobacco: Never Used  . Alcohol use Yes     Comment: socially     Allergies   Patient has no known allergies.   Review of Systems Review of Systems  Constitutional: Negative for chills and fever.  Musculoskeletal: Positive for arthralgias and gait problem. Negative for joint swelling and myalgias.  Skin: Negative for color change and wound.     Physical Exam Triage Vital Signs ED Triage Vitals  Enc Vitals Group     BP 10/28/16 1611 127/80     Pulse Rate 10/28/16 1611 74     Resp 10/28/16 1611 16     Temp 10/28/16 1611 98.5 F (36.9 C)     Temp Source 10/28/16 1611 Oral     SpO2 10/28/16 1611 99 %     Weight 10/28/16 1615 290 lb (131.5 kg)     Height 10/28/16 1615 5' 8.5" (1.74 m)     Head Circumference --      Peak Flow --      Pain Score --      Pain Loc --      Pain Edu? --      Excl. in GC? --    No data found.   Updated Vital Signs BP 127/80 (BP Location: Left Arm)   Pulse 74   Temp 98.5 F (36.9 C) (Oral)   Resp 16   Ht 5' 8.5" (1.74 m)   Wt 290 lb (131.5 kg)   SpO2 99%   BMI 43.45 kg/m   Visual Acuity Right Eye Distance:   Left Eye Distance:   Bilateral Distance:    Right Eye Near:   Left Eye Near:    Bilateral Near:     Physical Exam  Constitutional: He is oriented to person, place, and time. He appears well-developed and well-nourished. No distress.  HENT:  Head: Normocephalic and atraumatic.  Eyes: EOM are normal.  Neck: Normal range of motion.  Cardiovascular: Normal rate.   Pulmonary/Chest: Effort normal.  Musculoskeletal: Normal range of motion. He exhibits tenderness. He exhibits no edema.  Right great toe: no edema or deformity. Tenderness to 1st metatarsal joint. Full ROM  Neurological: He is alert and oriented to person, place, and time.  Skin: Skin is warm and dry. Capillary refill takes less than 2 seconds. He is not diaphoretic. No erythema.  Right foot and great toe: skin in tact. No ecchymosis  or erythema.   Psychiatric: He has a normal mood and affect. His behavior is normal.  Nursing note and vitals reviewed.    UC Treatments / Results  Labs (all labs ordered are listed, but only abnormal results are displayed) Labs Reviewed - No data to display  EKG  EKG Interpretation None  Radiology Dg Foot Complete Right  Result Date: 10/28/2016 CLINICAL DATA:  41 year old male with pain in the right great toe for the past several days. EXAM: RIGHT FOOT COMPLETE - 3+ VIEW COMPARISON:  No priors. FINDINGS: There is no evidence of fracture or dislocation. Joint space narrowing, subchondral sclerosis, subchondral cyst formation and osteophyte formation is noted in the first MTP joint. Soft tissues are unremarkable. IMPRESSION: 1. No acute radiographic abnormality of the right foot. 2. 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 Electronically Signed   By: Trudie Reedaniel  Entrikin M.D.   On: 10/28/2016 16:32    Procedures Procedures (including critical care time)  Medications Ordered in UC Medications - No data to display   Initial Impression / Assessment and Plan / UC Course  I have reviewed the triage vital signs and the nursing notes.  Pertinent labs & imaging results that were available during my care of the patient were reviewed by me and considered in my medical decision making (see chart for details).     No erythema or warmth of toe.  Plain films c/w OA of Right great toe w/o evidence of fracture or dislocation.   Encouraged f/u with PCP, Dr. Denyse Amassorey, this week for further evaluation and treatment of recurrent Right great toe pain.   Final Clinical Impressions(s) / UC Diagnoses   Final diagnoses:  Arthritis of metatarsophalangeal (MTP) joint of great toe    New Prescriptions Discharge Medication List as of 10/28/2016  4:45 PM       Controlled Substance Prescriptions [place] Controlled Substance Registry consulted? Not Applicable   Rolla Platehelps, Ivy Meriwether O, PA-C 10/28/16  1723

## 2016-10-28 NOTE — Discharge Instructions (Signed)
° °  You may take  acetaminophen every 4-6 hours or in combination with ibuprofen 400-600mg  every 6-8 hours as needed for pain and inflammation. You may also try over the counter metatarsal foot cushions. Ask your pharmacist for help finding them.  If you do use these, you should put one on each foot or in each shoe so that your stance and gait are balanced.

## 2016-10-28 NOTE — ED Triage Notes (Signed)
Toe Pain x 1 day

## 2016-11-28 ENCOUNTER — Ambulatory Visit: Payer: Self-pay | Admitting: Family Medicine

## 2016-11-29 ENCOUNTER — Encounter: Payer: Self-pay | Admitting: Family Medicine

## 2016-11-29 ENCOUNTER — Ambulatory Visit (INDEPENDENT_AMBULATORY_CARE_PROVIDER_SITE_OTHER): Payer: BLUE CROSS/BLUE SHIELD | Admitting: Family Medicine

## 2016-11-29 VITALS — BP 99/66 | HR 83 | Wt 295.0 lb

## 2016-11-29 DIAGNOSIS — E782 Mixed hyperlipidemia: Secondary | ICD-10-CM | POA: Diagnosis not present

## 2016-11-29 DIAGNOSIS — M79674 Pain in right toe(s): Secondary | ICD-10-CM

## 2016-11-29 DIAGNOSIS — E559 Vitamin D deficiency, unspecified: Secondary | ICD-10-CM

## 2016-11-29 DIAGNOSIS — E1169 Type 2 diabetes mellitus with other specified complication: Secondary | ICD-10-CM

## 2016-11-29 DIAGNOSIS — M79676 Pain in unspecified toe(s): Secondary | ICD-10-CM | POA: Insufficient documentation

## 2016-11-29 DIAGNOSIS — I10 Essential (primary) hypertension: Secondary | ICD-10-CM | POA: Diagnosis not present

## 2016-11-29 DIAGNOSIS — Z23 Encounter for immunization: Secondary | ICD-10-CM | POA: Diagnosis not present

## 2016-11-29 DIAGNOSIS — E1165 Type 2 diabetes mellitus with hyperglycemia: Secondary | ICD-10-CM

## 2016-11-29 LAB — POCT GLYCOSYLATED HEMOGLOBIN (HGB A1C): Hemoglobin A1C: 7.9

## 2016-11-29 NOTE — Progress Notes (Signed)
Russell Ramos is a 41 y.o. male who presents to [name]: Primary Care Sports Medicine today for  Hypertension diabetes anxiety low Testosterone.  HTN: Odie takes lisinopril for renal prophylaxis and hypertension. He tolerates the medication well with no chest pain palpitations or shortness of breath. No lightheadedness or dizziness.  Diabetes: Cloyce takes [place] daily as well as Trulicity weekly. He notes his blood sugars are typically in the 120s. He is also trying to eat a lower carbohydrate diet. He notes he's been a little less careful with his diet recently. No polyuria or polydipsia.  Anxiety: This is currently quite well-controlled. He is doing so well that he's been able to stop that the Klonopin and Zoloft. He says that he is asymptomatic currently.   Low testosterone: Soma has stopped his testosterone for over a month now when doesn't feel any different. He does not think he needs it currently.   Past Medical History:  Diagnosis Date  . Diabetes mellitus without complication (HCC)   . Hypertension   . Pulmonary embolism (HCC)   . Pulmonary hypertension (HCC)    Past Surgical History:  Procedure Laterality Date  . KNEE SURGERY Left    Social History  Substance Use Topics  . Smoking status: Former Smoker    Packs/day: 1.00    Types: Cigarettes    Quit date: 09/12/2015  . Smokeless tobacco: Never Used  . Alcohol use Yes     Comment: socially   family history includes Diabetes in his mother.  ROS as above:  Medications: Current Outpatient Prescriptions  Medication Sig Dispense Refill  . AMBULATORY NON FORMULARY MEDICATION Single glucometer with lancets, test strips. Use glucometer of choice Test daily.  Uncontrolled Diabetes E11.9 1 each 0  . AMBULATORY NON FORMULARY MEDICATION Continuous positive airway pressure (CPAP) machine auto titrate to a max pressure  of 15 cm of H2O pressure, with all supplemental supplies as needed. AHI 13.3 1 each 0  . AMBULATORY NON FORMULARY MEDICATION 3ml syringe, 21g 1.5 inch needles and 18 gauge blunt filler needles for testosterone injections every 2 weeks. Disp qs x 3 months. 1 each 0  . atorvastatin (LIPITOR) 40 MG tablet Take 1 tablet (40 mg total) by mouth daily. 90 tablet 3  . empagliflozin (JARDIANCE) 25 MG TABS tablet Take 25 mg by mouth daily. 90 tablet 3  . lisinopril (PRINIVIL,ZESTRIL) 5 MG tablet TAKE 1 TABLET (5 MG TOTAL) BY MOUTH DAILY. 90 tablet 1  . omeprazole (PRILOSEC) 40 MG capsule TAKE 1 CAPSULE (40 MG TOTAL) BY MOUTH DAILY. 90 capsule 3  . SAFETY-LOK 3CC SYR 22GX1.5" 22G X 1-1/2" 3 ML MISC TO BE USED TO DRAW TESTOSTERONE FOR INJECTION 6 each 0  . TRULICITY 1.5 MG/0.5ML SOPN INJECT 1.5 MG INTO THE SKIN ONCE A WEEK. 6 pen 6   No current facility-administered medications for this visit.    No Known Allergies  Health Maintenance Health Maintenance  Topic Date Due  . OPHTHALMOLOGY EXAM  07/20/1987  . HEMOGLOBIN A1C  05/30/2017  . FOOT EXAM  08/29/2017  . PNEUMOCOCCAL POLYSACCHARIDE VACCINE (2) 09/15/2020  . TETANUS/TDAP  09/15/2025  . INFLUENZA VACCINE  Completed  . HIV Screening  Completed     Exam:  BP 99/66   Pulse 83   Wt 295 lb (133.8 kg)   BMI 44.20 kg/m   Wt Readings from Last 5 Encounters:  11/29/16 295 lb (133.8 kg)  10/28/16 290 lb (131.5 kg)  08/29/16 (!) 303 lb (137.4 kg)  06/01/16 (!) 309 lb (140.2 kg)  03/19/16 (!) 316 lb (143.3 kg)    Gen: Well NAD HEENT: EOMI,  MMM Lungs: Normal work of breathing. CTABL Heart: RRR no MRG Abd: NABS, Soft. Nondistended, Nontender Exts: Brisk capillary refill, warm and well perfused.      Results for orders placed or performed in visit on 11/29/16 (from the past 72 hour(s))  POCT HgB A1C     Status: None   Collection Time: 11/29/16  9:24 AM  Result Value Ref Range   Hemoglobin A1C 7.9    No results  found.    Assessment and Plan: 41 y.o. male with  Hypertension blood pressure goal continue current regimen check metabolic panel.  Diabetes A1c a bit elevated today. Plan to work on a low carbohydrate diet and recheck in 3 months.  Anxiety doing quite well continue lifestyle modification and recheck in 3 months.  Low testosterone doing well off testosterone. We'll follow along. Restart testosterone as patient thinks that'll help.  We'll check basic fasting labs as well for her lipids uric acid and vitamin D.   Influenza vaccine given today.  Recommend diabetic eye exam.   Orders Placed This Encounter  Procedures  . Flu Vaccine QUAD 36+ mos IM  . CBC  . COMPLETE METABOLIC PANEL WITH GFR  . Lipid Panel w/reflex Direct LDL  . Uric acid  . VITAMIN D 25 Hydroxy (Vit-D Deficiency, Fractures)  . POCT HgB A1C   No orders of the defined types were placed in this encounter.    Discussed warning signs or symptoms. Please see discharge instructions. Patient expresses understanding.

## 2016-11-29 NOTE — Patient Instructions (Addendum)
Thank you for coming in today. Get labs today.  Recheck in 3 months.  Work on low [employer].    Hallux Rigidus Hallux rigidus is a type of joint pain or joint disease (arthritis) that affects your big toe (hallux). This condition involves the joint that connects the base of your big toe to the main part of your foot (metatarsophalangeal joint). This condition can cause your big toe to become stiff, painful, and difficult to move. Symptoms may get worse with movement or in cold or damp weather. The condition also gets worse over time. What are the causes? This condition may be caused by having a foot that does not function the way that it should or has an abnormal shape (structural deformity). These foot problems can run in families (be hereditary). This condition can also be caused by:  Injury.  Overuse.  Certain inflammatory diseases, including gout and rheumatoid arthritis.  What increases the risk? This condition is more likely to develop in people who:  Have a foot bone (metatarsal) that is longer or higher than normal.  Have a family history of hallux rigidus.  Have previously injured their big toe.  Have feet that do not have a curve (arch) on the inner side of the foot. This may be called flat feet or fallen arches.  Turn their ankles in when they walk (pronation).  Have rheumatoid arthritis or gout.  Have to stoop down often at work.  What are the signs or symptoms? Symptoms of this condition include:  Big toe pain.  Stiffness and difficulty moving the big toe.  Swelling of the toe and surrounding area.  Bone spurs. These are bony growths that can form on the joint of the big toe.  A limp.  How is this diagnosed? This condition is diagnosed based on a medical history and physical exam. This may include X-rays. How is this treated? Treatment for this condition includes:  Wearing roomy, comfortable shoes that have a large toe box.  Putting orthotic devices  in your shoes.  Pain medicines.  Physical therapy.  Icing the injured area.  Alternate between putting your foot in cold water then warm water.  If your condition is severe, treatment may include:  Corticosteroid injections to relieve pain.  Surgery to remove bone spurs, fuse damaged bones together, or replace the entire joint.  Follow these instructions at home:  Take over-the-counter and prescription medicines only as told by your health care provider.  Do not wear high heels or other restrictive footwear. Wear comfortable, supportive shoes that have a large toe box.  Wear orthotics as told by your health care provider, if this applies.  Put your feet in cold water for 30 seconds, then in warm water for 30 seconds. Alternate between the cold and warm water for 5 minutes. Do this several times a day or as told by your health care provider.  If directed, apply ice to the injured area. ? Put ice in a plastic bag. ? Place a towel between your skin and the bag. ? Leave the ice on for 20 minutes, 2-3 times per day.  Do foot exercises as instructed by your health care provider or a physical therapist.  Keep all follow-up visits as told by your health care provider. This is important. Contact a health care provider if:  You notice bone spurs or growths on or around your big toe.  Your pain does not get better or it gets worse.  You have pain while  resting.  You have pain in other parts of your body, such as your back, hip, or knee.  You start to limp. This information is not intended to replace advice given to you by your health care provider. Make sure you discuss any questions you have with your health care provider. Document Released: 02/05/2005 Document Revised: 07/14/2015 Document Reviewed: 10/13/2014 Elsevier Interactive Patient Education  [employer].

## 2016-12-09 ENCOUNTER — Other Ambulatory Visit: Payer: Self-pay | Admitting: Family Medicine

## 2016-12-09 DIAGNOSIS — E1165 Type 2 diabetes mellitus with hyperglycemia: Secondary | ICD-10-CM

## 2016-12-09 DIAGNOSIS — E1169 Type 2 diabetes mellitus with other specified complication: Secondary | ICD-10-CM

## 2016-12-09 DIAGNOSIS — E782 Mixed hyperlipidemia: Secondary | ICD-10-CM

## 2016-12-09 DIAGNOSIS — R5382 Chronic fatigue, unspecified: Secondary | ICD-10-CM

## 2016-12-09 DIAGNOSIS — I10 Essential (primary) hypertension: Secondary | ICD-10-CM

## 2016-12-09 DIAGNOSIS — R748 Abnormal levels of other serum enzymes: Secondary | ICD-10-CM

## 2017-02-28 ENCOUNTER — Other Ambulatory Visit: Payer: Self-pay | Admitting: Family Medicine

## 2017-03-01 ENCOUNTER — Ambulatory Visit: Payer: Self-pay | Admitting: Family Medicine

## 2017-03-01 DIAGNOSIS — Z0189 Encounter for other specified special examinations: Secondary | ICD-10-CM

## 2017-03-01 NOTE — Telephone Encounter (Signed)
Patient no showed his follow up appointment today. Rhonda Cunningham,CMA

## 2017-03-07 ENCOUNTER — Other Ambulatory Visit: Payer: Self-pay | Admitting: Family Medicine

## 2017-03-07 DIAGNOSIS — E782 Mixed hyperlipidemia: Secondary | ICD-10-CM

## 2017-03-07 DIAGNOSIS — R748 Abnormal levels of other serum enzymes: Secondary | ICD-10-CM

## 2017-03-07 DIAGNOSIS — E1165 Type 2 diabetes mellitus with hyperglycemia: Secondary | ICD-10-CM

## 2017-03-07 DIAGNOSIS — E1169 Type 2 diabetes mellitus with other specified complication: Secondary | ICD-10-CM

## 2017-03-07 DIAGNOSIS — R5382 Chronic fatigue, unspecified: Secondary | ICD-10-CM

## 2017-03-07 DIAGNOSIS — I10 Essential (primary) hypertension: Secondary | ICD-10-CM

## 2017-03-26 ENCOUNTER — Encounter: Payer: Self-pay | Admitting: Family Medicine

## 2017-03-26 ENCOUNTER — Ambulatory Visit (INDEPENDENT_AMBULATORY_CARE_PROVIDER_SITE_OTHER): Payer: BLUE CROSS/BLUE SHIELD | Admitting: Family Medicine

## 2017-03-26 VITALS — BP 128/84 | HR 69 | Ht 68.5 in | Wt 310.0 lb

## 2017-03-26 DIAGNOSIS — M7522 Bicipital tendinitis, left shoulder: Secondary | ICD-10-CM | POA: Diagnosis not present

## 2017-03-26 MED ORDER — DICLOFENAC SODIUM 1 % TD GEL: 2.0000 g | Freq: Four times a day (QID) | TRANSDERMAL | 11 refills | Status: DC

## 2017-03-26 MED ORDER — HYDROCODONE-ACETAMINOPHEN 5-325 MG PO TABS: 1.0000 | ORAL_TABLET | Freq: Four times a day (QID) | ORAL | 0 refills | Status: DC | PRN

## 2017-03-26 NOTE — Progress Notes (Signed)
Russell Ramos is a 42 y.o. male who presents to [facility] today for left shoulder pain.  Patient was moving heavy dresser and experienced popping sensation in left shoulder. Patient was performing arm flexion motion at time of injury. He heard and felt pop. Initially, patient experienced some pain but was able to abduct shoulder above 90 degrees after injury. However, after approximately 1 hour after injury, patient began to experience significant arm and shoulder pain with any attempted movement. Now needs to hold arm in slight pronation against body to achieve any relief from pain. Abduction, external rotation of shoulder and bicep flexion significantly limited due to pain. Patient tried heat and ibuprofen over night with limited pain relief.    Past Medical History:  Diagnosis Date  . Diabetes mellitus without complication (HCC)   . Hypertension   . Pulmonary embolism (HCC)   . Pulmonary hypertension (HCC)    Past Surgical History:  Procedure Laterality Date  . KNEE SURGERY Left    Social History   Tobacco Use  . Smoking status: Former Smoker    Packs/day: 1.00    Types: Cigarettes    Last attempt to quit: 09/12/2015    Years since quitting: 1.5  . Smokeless tobacco: Never Used  Substance Use Topics  . Alcohol use: Yes    Comment: socially     ROS:  As above   Medications: Current Outpatient Medications  Medication Sig Dispense Refill  . AMBULATORY NON FORMULARY MEDICATION Single glucometer with lancets, test strips. Use glucometer of choice Test daily.  Uncontrolled Diabetes E11.9 1 each 0  . AMBULATORY NON FORMULARY MEDICATION Continuous positive airway pressure (CPAP) machine auto titrate to a max pressure of 15 cm of H2O pressure, with all supplemental supplies as needed. AHI 13.3 1 each 0  . AMBULATORY NON FORMULARY MEDICATION 3ml syringe, 21g 1.5 inch needles and 18 gauge blunt filler needles for testosterone  injections every 2 weeks. Disp qs x 3 months. 1 each 0  . atorvastatin (LIPITOR) 40 MG tablet Take 1 tablet (40 mg total) by mouth daily. 90 tablet 3  . clonazePAM (KLONOPIN) 0.5 MG tablet TAKE 1 TABLET BY MOUTH TWICE DAILY 60 tablet 0  . empagliflozin (JARDIANCE) 25 MG TABS tablet Take 25 mg by mouth daily. 90 tablet 3  . lisinopril (PRINIVIL,ZESTRIL) 5 MG tablet Take 1 tablet (5 mg total) by mouth daily. APPOINTMENT NEEDED FOR FURTHER REFILLS 30 tablet 0  . omeprazole (PRILOSEC) 40 MG capsule TAKE 1 CAPSULE (40 MG TOTAL) BY MOUTH DAILY. 90 capsule 3  . SAFETY-LOK 3CC SYR 22GX1.5" 22G X 1-1/2" 3 ML MISC TO BE USED TO DRAW 1ML TESTOSTERONE FOR INJECTION 6 each 0  . TRULICITY 1.5 MG/0.5ML SOPN INJECT 1.5 MG INTO THE SKIN ONCE A WEEK. 6 pen 6  . diclofenac sodium (VOLTAREN) 1 % GEL Apply 2 g topically 4 (four) times daily. To affected joint. 100 g 11  . HYDROcodone-acetaminophen (NORCO/VICODIN) 5-325 MG tablet Take 1 tablet by mouth every 6 (six) hours as needed. 15 tablet 0   No current facility-administered medications for this visit.    No Known Allergies   Exam:  BP 128/84   Pulse 69   Ht 5' 8.5" (1.74 m)   Wt (!) 310 lb (140.6 kg)   BMI 46.45 kg/m  General: Well Developed, well nourished, and in no acute distress.  Neuro/Psych: Alert and oriented x3, extra-ocular muscles intact, able to move all 4 extremities, sensation grossly  intact. Skin: Warm and dry, no rashes noted.  Respiratory: Not using accessory muscles, speaking in full sentences, trachea midline.  Cardiovascular: Pulses palpable, no extremity edema. Abdomen: Does not appear distended. MSK:  L Shoulder: Normal in appearance without obvious effusion or bruising. No Popeye deformity present.  TTP bicipital groove Active ROM significantly limited due to pain. Unable to abduct shoulder past 15 degrees. Passive ROM intact but painful. Strength testing limited. Yergason's and speeds positive.   Limited MSK [place]: Biceps  tendon is intact in the groove surrounded by hypoechoic fluid.  Image quality degraded by body habitus.  Subscapularis supraspinatus and infraspinatus tendons appear to be intact.  Normal bony structures.  No results found for this or any previous visit (from the past 48 hour(s)). No results found.  Assessment and Plan: 42 y.o. male with likely partial biceps tendon tear vs biceps tendon subluxation. Rotator cuff tendons and bicep tendon appear intact on ultrasound.   Point of care [place] was performed and biceps tendon as well as rotator cuff tendons are intact. Fluid surrounding biceps tendon in bicipital groove concerning for partial tear vs subluxation. Minimal concern for full thickness tear of biceps tendon or rotator cuff complex.   Patient will benefit from topical pain relief and temporary support with sling. Patient should return to follow up on Friday for reexamination. Pain relief with short course of NORCO.   No orders of the defined types were placed in this encounter.  Meds ordered this encounter  Medications  . HYDROcodone-acetaminophen (NORCO/VICODIN) 5-325 MG tablet    Sig: Take 1 tablet by mouth every 6 (six) hours as needed.    Dispense:  15 tablet    Refill:  0  . diclofenac sodium (VOLTAREN) 1 % GEL    Sig: Apply 2 g topically 4 (four) times daily. To affected joint.    Dispense:  100 g    Refill:  11    Discussed warning signs or symptoms. Please see discharge instructions. Patient expresses understanding.

## 2017-03-26 NOTE — Patient Instructions (Signed)
Thank you for coming in today. Recheck Friday.  Use the sling as needed.  Work on arm motion if able.  Next step is injection.    Biceps Tendon Tendinitis (Proximal) and Tenosynovitis Rehab Ask your health care provider which exercises are safe for you. Do exercises exactly as told by your health care provider and adjust them as directed. It is normal to feel mild stretching, pulling, tightness, or discomfort as you do these exercises, but you should stop right away if you feel sudden pain or your pain gets worse.Do not begin these exercises until told by your health care provider. Stretching and range of motion exercises These exercises warm up your muscles and joints and improve the movement and flexibility of your arm and shoulder. These exercises also help to relieve pain and stiffness. Exercise A: Shoulder flexion  1. Stand facing a wall. Put your left / right hand on the wall. 2. Slide your left / right hand up the wall. Stop when you feel a stretch in your shoulder, or when you reach the angle that is recommended by your health care provider. ? Use your other hand to help raise your arm, if needed. ? As your hand gets higher, you may need to step closer to the wall. ? Avoid shrugging your shoulder while you raise your arm. To do this, keep your shoulder blade tucked down toward your spine. 3. Hold for __________ seconds. 4. Slowly return to the starting position. Use your other arm to help, if needed. Repeat __________ times. Complete this exercise __________ times a day. Exercise B: Posterior capsule stretch ( passive horizontal adduction) 1. Sit or stand and pull your left / right elbow across your chest, toward your other shoulder. Stop when you feel a gentle stretch in the back of your shoulder and upper arm. ? Keep your arm at shoulder height. ? Keep your arm as close to your body as you comfortably can. 2. Hold for __________ seconds. 3. Slowly return to the starting  position. Repeat __________ times. Complete this exercise __________ times a day. Strengthening exercises These exercises build strength and endurance in your arm and shoulder. Endurance is the ability to use your muscles for a long time, even after your muscles get tired. Exercise C: Elbow flexion, supinated  1. Sit on a stable chair without armrests, or stand. 2. If directed, hold a __________ weight in your left / right hand, or hold an exercise band with both hands. Your palms should face up toward the ceiling at the starting position. 3. Bend your left / right elbow and move your hand up toward your shoulder. Keep your other arm straight down, in the starting position. 4. Slowly return to the starting position. Repeat __________ times. Complete this exercise __________ times a day. Exercise D: Scapular protraction, supine  1. Lie on your back on a firm surface. If directed, hold a __________ weight in your left / right hand. 2. Raise your left / right arm straight into the air so your hand is directly above your shoulder joint. 3. Push the weight into the air so your shoulder lifts off of the surface that you are lying on. Do not move your head, neck, or back. 4. Hold for __________ seconds. 5. Slowly return to the starting position. Let your muscles relax completely before you repeat this exercise. Repeat __________ times. Complete this exercise __________ times a day. Exercise E: Scapular retraction  1. Sit in a stable chair without armrests, or stand.  2. Secure an exercise band to a stable object in front of you so the band is at shoulder height. 3. Hold one end of the exercise band in each hand. 4. Squeeze your shoulder blades together and move your elbows slightly behind you. Do not shrug your shoulders. 5. Hold for __________ seconds. 6. Slowly return to the starting position. Repeat __________ times. Complete this exercise __________ times a day. This information is not  intended to replace advice given to you by your health care provider. Make sure you discuss any questions you have with your health care provider. Document Released: 02/05/2005 Document Revised: 10/13/2015 Document Reviewed: 01/14/2015 Elsevier Interactive Patient Education  [employer].

## 2017-03-29 ENCOUNTER — Encounter: Payer: Self-pay | Admitting: Family Medicine

## 2017-03-29 ENCOUNTER — Ambulatory Visit: Payer: BLUE CROSS/BLUE SHIELD | Admitting: Family Medicine

## 2017-03-29 VITALS — BP 121/88 | HR 85 | Ht 68.5 in | Wt 305.0 lb

## 2017-03-29 DIAGNOSIS — E559 Vitamin D deficiency, unspecified: Secondary | ICD-10-CM | POA: Diagnosis not present

## 2017-03-29 DIAGNOSIS — M25512 Pain in left shoulder: Secondary | ICD-10-CM | POA: Diagnosis not present

## 2017-03-29 DIAGNOSIS — I272 Pulmonary hypertension, unspecified: Secondary | ICD-10-CM

## 2017-03-29 DIAGNOSIS — E1165 Type 2 diabetes mellitus with hyperglycemia: Secondary | ICD-10-CM | POA: Diagnosis not present

## 2017-03-29 DIAGNOSIS — E782 Mixed hyperlipidemia: Secondary | ICD-10-CM | POA: Diagnosis not present

## 2017-03-29 DIAGNOSIS — E1169 Type 2 diabetes mellitus with other specified complication: Secondary | ICD-10-CM

## 2017-03-29 DIAGNOSIS — R7989 Other specified abnormal findings of blood chemistry: Secondary | ICD-10-CM | POA: Diagnosis not present

## 2017-03-29 DIAGNOSIS — I10 Essential (primary) hypertension: Secondary | ICD-10-CM

## 2017-03-29 DIAGNOSIS — R748 Abnormal levels of other serum enzymes: Secondary | ICD-10-CM

## 2017-03-29 NOTE — Progress Notes (Signed)
Russell Ramos is a 42 y.o. male who presents to [facility] today for left shoulder pain.  Russell Ramos was seen earlier this week for acute onset of left shoulder pain.  This was thought to be either bicipital tendinitis or rotator cuff tendinitis without full thickness tear.  He had a trial of relative rest over the last few days.  He notes about 50% improvement.  He no longer is exquisitely painful in his left shoulder but does note continued pain.  He notes the pain is worse particularly with shoulder abduction.  He notes with no resistance he has no pain to wrist supination.  He thinks he can go back to work on Monday but is interested in further treatment if possible for his left shoulder pain.  Additionally patient is due for follow-up regarding diabetes hypertension hyperlipidemia.  He thinks he is feeling pretty well with no chest pain palpitations or shortness of breath.  He denies any hypoglycemic episodes  Past Medical History:  Diagnosis Date  . Diabetes mellitus without complication (HCC)   . Hypertension   . Pulmonary embolism (HCC)   . Pulmonary hypertension (HCC)    Past Surgical History:  Procedure Laterality Date  . KNEE SURGERY Left    Social History   Tobacco Use  . Smoking status: Former Smoker    Packs/day: 1.00    Types: Cigarettes    Last attempt to quit: 09/12/2015    Years since quitting: 1.5  . Smokeless tobacco: Never Used  Substance Use Topics  . Alcohol use: Yes    Comment: socially     ROS:  As above   Medications: Current Outpatient Medications  Medication Sig Dispense Refill  . AMBULATORY NON FORMULARY MEDICATION Single glucometer with lancets, test strips. Use glucometer of choice Test daily.  Uncontrolled Diabetes E11.9 1 each 0  . AMBULATORY NON FORMULARY MEDICATION Continuous positive airway pressure (CPAP) machine auto titrate to a max pressure of 15 cm of H2O pressure, with all supplemental  supplies as needed. AHI 13.3 1 each 0  . AMBULATORY NON FORMULARY MEDICATION 3ml syringe, 21g 1.5 inch needles and 18 gauge blunt filler needles for testosterone injections every 2 weeks. Disp qs x 3 months. 1 each 0  . atorvastatin (LIPITOR) 40 MG tablet Take 1 tablet (40 mg total) by mouth daily. 90 tablet 3  . clonazePAM (KLONOPIN) 0.5 MG tablet TAKE 1 TABLET BY MOUTH TWICE DAILY 60 tablet 0  . diclofenac sodium (VOLTAREN) 1 % GEL Apply 2 g topically 4 (four) times daily. To affected joint. 100 g 11  . empagliflozin (JARDIANCE) 25 MG TABS tablet Take 25 mg by mouth daily. 90 tablet 3  . HYDROcodone-acetaminophen (NORCO/VICODIN) 5-325 MG tablet Take 1 tablet by mouth every 6 (six) hours as needed. 15 tablet 0  . lisinopril (PRINIVIL,ZESTRIL) 5 MG tablet Take 1 tablet (5 mg total) by mouth daily. APPOINTMENT NEEDED FOR FURTHER REFILLS 30 tablet 0  . omeprazole (PRILOSEC) 40 MG capsule TAKE 1 CAPSULE (40 MG TOTAL) BY MOUTH DAILY. 90 capsule 3  . SAFETY-LOK 3CC SYR 22GX1.5" 22G X 1-1/2" 3 ML MISC TO BE USED TO DRAW TESTOSTERONE FOR INJECTION 6 each 0  . TRULICITY 1.5 MG/0.5ML SOPN INJECT 1.5 MG INTO THE SKIN ONCE A WEEK. 6 pen 6   No current facility-administered medications for this visit.    No Known Allergies   Exam:  BP 121/88   Pulse 85   Ht 5' 8.5" (1.74  m)   Wt (!) 305 lb (138.3 kg)   BMI 45.70 kg/m  General: Well Developed, well nourished, and in no acute distress.  Neuro/Psych: Alert and oriented x3, extra-ocular muscles intact, able to move all 4 extremities, sensation grossly intact. Skin: Warm and dry, no rashes noted.  Respiratory: Not using accessory muscles, speaking in full sentences, trachea midline.  Cardiovascular: Pulses palpable, no extremity edema. Abdomen: Does not appear distended. MSK:  Left shoulder: Normal-appearing Tender to palpation at the bicipital groove and acromioclavicular joint. Motion is limited in abduction to about 100 degrees  active External and internal rotation are intact Pain is present with resisted abduction however strength is intact Yergason's and speeds tests are still positive but less so than earlier this week Pulses capillary refill and sensation are intact distally.  Limited musculoskeletal ultrasound of the left shoulder Intact appearing supraspinatus tendon with thickened subacromial bursa.  Normal bony structures.  Procedure: Real-time Ultrasound Guided Injection of left subacromial bursa injection Device: GE Logiq E  Images permanently stored and available for review in the ultrasound unit. Verbal informed consent obtained. Discussed risks and benefits of procedure. Warned about infection bleeding damage to structures skin hypopigmentation and fat atrophy among others. Patient expresses understanding and agreement Time-out conducted.  Noted no overlying erythema, induration, or other signs of local infection.  Skin prepped in a sterile fashion.  Local anesthesia: Topical Ethyl chloride.  With sterile technique and under real time ultrasound guidance:40 mg of Kenalog and 2 mL of Marcaine injected easily.  Completed without difficulty  Pain did not resolve suggesting the location of pain is not in the subacromial space Advised to call if fevers/chills, erythema, induration, drainage, or persistent bleeding.  Images permanently stored and available for review in the ultrasound unit.  Impression: Technically successful ultrasound guided injection.    No results found for this or any previous visit (from the past 48 hour(s)). No results found.    Assessment and Plan: 42 y.o. male with  Left shoulder pain:  Improving but not resolved.  This problem remains complex as the differential diagnosis is still broad including labrum tear rotator cuff dysfunction or bicipital tendinitis.  Patient had a subacromial injection today and did not have complete resolution of pain with the numbing  medicine.  I suspect the pain is probably more likely to be bicipital tendinitis.  Regardless with further rest and the steroid injection in the space near the bicipital tendon I suspect he will have good improvement.  I think is reasonable to proceed with continued home exercise and return to work on Monday if able.  Recheck in a few weeks if not improved or sooner if needed.  Hypertension hyperlipidemia and diabetes.  Doing reasonably well.  Check basic fasting labs listed below.  Will adjust medicines based on lab results.  Likely recheck within at least 3 months.  Recommend diabetic eye exam.     Orders Placed This Encounter  Procedures  . CBC  . COMPLETE METABOLIC PANEL WITH GFR  . Lipid Panel w/reflex Direct LDL  . Hemoglobin A1c  . TSH  . VITAMIN D 25 Hydroxy (Vit-D Deficiency, Fractures)   No orders of the defined types were placed in this encounter.   Discussed warning signs or symptoms. Please see discharge instructions. Patient expresses understanding.

## 2017-03-29 NOTE — Patient Instructions (Signed)
Thank you for coming in today. Pay attention to pay over the next few hours.  Return to work on Monday if able.  If not able let me know.  Get labs now.  Return sooner if needed.  Call or go to the ER if you develop a large red swollen joint with extreme pain or oozing puss.

## 2017-03-30 LAB — COMPLETE METABOLIC PANEL WITH GFR
AG Ratio: 1.2 (calc) (ref 1.0–2.5)
ALBUMIN MSPROF: 4.8 g/dL (ref 3.6–5.1)
ALT: 15 U/L (ref 9–46)
AST: 13 U/L (ref 10–40)
Alkaline phosphatase (APISO): 73 U/L (ref 40–115)
BUN: 25 mg/dL (ref 7–25)
CALCIUM: 10.3 mg/dL (ref 8.6–10.3)
CO2: 23 mmol/L (ref 20–32)
CREATININE: 1 mg/dL (ref 0.60–1.35)
Chloride: 101 mmol/L (ref 98–110)
GFR, EST AFRICAN AMERICAN: 109 mL/min/{1.73_m2} (ref >=60)
GFR, EST NON AFRICAN AMERICAN: 94 mL/min/{1.73_m2} (ref >=60)
GLUCOSE: 151 mg/dL — AB (ref 65–99)
Globulin: 3.9 g/dL (calc) — ABNORMAL HIGH (ref 1.9–3.7)
Potassium: 4.9 mmol/L (ref 3.5–5.3)
Sodium: 135 mmol/L (ref 135–146)
TOTAL PROTEIN: 8.7 g/dL — AB (ref 6.1–8.1)
Total Bilirubin: 0.4 mg/dL (ref 0.2–1.2)

## 2017-03-30 LAB — CBC
HCT: 49.4 % (ref 38.5–50.0)
HEMOGLOBIN: 17.2 g/dL — AB (ref 13.2–17.1)
MCH: 31.1 pg (ref 27.0–33.0)
MCHC: 34.8 g/dL (ref 32.0–36.0)
MCV: 89.3 fL (ref 80.0–100.0)
MPV: 8.7 fL (ref 7.5–12.5)
Platelets: 448 10*3/uL — ABNORMAL HIGH (ref 140–400)
RBC: 5.53 10*6/uL (ref 4.20–5.80)
RDW: 12 % (ref 11.0–15.0)
WBC: 10.7 10*3/uL (ref 3.8–10.8)

## 2017-03-30 LAB — LIPID PANEL W/REFLEX DIRECT LDL
CHOL/HDL RATIO: 6.2 (calc) — AB (ref <5.0)
Cholesterol: 216 mg/dL — ABNORMAL HIGH (ref <200)
HDL: 35 mg/dL — ABNORMAL LOW (ref >40)
LDL CHOLESTEROL (CALC): 133 mg/dL — AB
NON-HDL CHOLESTEROL (CALC): 181 mg/dL — AB (ref <130)
TRIGLYCERIDES: 336 mg/dL — AB (ref <150)

## 2017-03-30 LAB — HEMOGLOBIN A1C
EAG (MMOL/L): 9.3 (calc)
Hgb A1c MFr Bld: 7.5 % of total Hgb — ABNORMAL HIGH (ref <5.7)
Mean Plasma Glucose: 169 (calc)

## 2017-03-30 LAB — TSH: TSH: 2.68 m[IU]/L (ref 0.40–4.50)

## 2017-03-30 LAB — VITAMIN D 25 HYDROXY (VIT D DEFICIENCY, FRACTURES): VIT D 25 HYDROXY: 30 ng/mL (ref 30–100)

## 2017-04-01 ENCOUNTER — Other Ambulatory Visit: Payer: Self-pay | Admitting: Family Medicine

## 2017-04-01 DIAGNOSIS — I10 Essential (primary) hypertension: Secondary | ICD-10-CM

## 2017-04-01 DIAGNOSIS — R748 Abnormal levels of other serum enzymes: Secondary | ICD-10-CM

## 2017-04-01 DIAGNOSIS — E1165 Type 2 diabetes mellitus with hyperglycemia: Secondary | ICD-10-CM

## 2017-04-01 DIAGNOSIS — R5382 Chronic fatigue, unspecified: Secondary | ICD-10-CM

## 2017-04-01 DIAGNOSIS — E782 Mixed hyperlipidemia: Secondary | ICD-10-CM

## 2017-04-01 DIAGNOSIS — E1169 Type 2 diabetes mellitus with other specified complication: Secondary | ICD-10-CM

## 2017-04-01 MED ORDER — ATORVASTATIN CALCIUM 80 MG PO TABS: 80.0000 mg | ORAL_TABLET | Freq: Every day | ORAL | 1 refills | Status: DC

## 2017-04-05 ENCOUNTER — Other Ambulatory Visit: Payer: Self-pay | Admitting: Family Medicine

## 2017-04-05 DIAGNOSIS — E1165 Type 2 diabetes mellitus with hyperglycemia: Secondary | ICD-10-CM

## 2017-04-05 DIAGNOSIS — R748 Abnormal levels of other serum enzymes: Secondary | ICD-10-CM

## 2017-04-05 DIAGNOSIS — I10 Essential (primary) hypertension: Secondary | ICD-10-CM

## 2017-04-05 DIAGNOSIS — R5382 Chronic fatigue, unspecified: Secondary | ICD-10-CM

## 2017-04-05 DIAGNOSIS — E782 Mixed hyperlipidemia: Secondary | ICD-10-CM

## 2017-04-05 DIAGNOSIS — E1169 Type 2 diabetes mellitus with other specified complication: Secondary | ICD-10-CM

## 2017-06-17 ENCOUNTER — Other Ambulatory Visit: Payer: Self-pay | Admitting: Family Medicine

## 2017-07-05 ENCOUNTER — Other Ambulatory Visit: Payer: Self-pay | Admitting: Family Medicine

## 2017-07-05 DIAGNOSIS — E782 Mixed hyperlipidemia: Secondary | ICD-10-CM

## 2017-07-05 DIAGNOSIS — R748 Abnormal levels of other serum enzymes: Secondary | ICD-10-CM

## 2017-07-05 DIAGNOSIS — I10 Essential (primary) hypertension: Secondary | ICD-10-CM

## 2017-07-05 DIAGNOSIS — E1169 Type 2 diabetes mellitus with other specified complication: Secondary | ICD-10-CM

## 2017-07-05 DIAGNOSIS — R5382 Chronic fatigue, unspecified: Secondary | ICD-10-CM

## 2017-07-05 DIAGNOSIS — E1165 Type 2 diabetes mellitus with hyperglycemia: Secondary | ICD-10-CM

## 2017-08-03 ENCOUNTER — Other Ambulatory Visit: Payer: Self-pay | Admitting: Family Medicine

## 2017-08-03 DIAGNOSIS — E782 Mixed hyperlipidemia: Secondary | ICD-10-CM

## 2017-08-03 DIAGNOSIS — E1165 Type 2 diabetes mellitus with hyperglycemia: Secondary | ICD-10-CM

## 2017-08-03 DIAGNOSIS — R748 Abnormal levels of other serum enzymes: Secondary | ICD-10-CM

## 2017-08-03 DIAGNOSIS — R5382 Chronic fatigue, unspecified: Secondary | ICD-10-CM

## 2017-08-03 DIAGNOSIS — I10 Essential (primary) hypertension: Secondary | ICD-10-CM

## 2017-08-03 DIAGNOSIS — E1169 Type 2 diabetes mellitus with other specified complication: Secondary | ICD-10-CM

## 2017-08-06 ENCOUNTER — Other Ambulatory Visit: Payer: Self-pay | Admitting: Family Medicine

## 2017-08-06 DIAGNOSIS — R5382 Chronic fatigue, unspecified: Secondary | ICD-10-CM

## 2017-08-06 DIAGNOSIS — R748 Abnormal levels of other serum enzymes: Secondary | ICD-10-CM

## 2017-08-06 DIAGNOSIS — E1169 Type 2 diabetes mellitus with other specified complication: Secondary | ICD-10-CM

## 2017-08-06 DIAGNOSIS — I10 Essential (primary) hypertension: Secondary | ICD-10-CM

## 2017-08-06 DIAGNOSIS — E1165 Type 2 diabetes mellitus with hyperglycemia: Secondary | ICD-10-CM

## 2017-08-06 DIAGNOSIS — E782 Mixed hyperlipidemia: Secondary | ICD-10-CM

## 2017-08-21 ENCOUNTER — Ambulatory Visit: Payer: BLUE CROSS/BLUE SHIELD | Admitting: Family Medicine

## 2017-08-21 ENCOUNTER — Encounter: Payer: Self-pay | Admitting: Family Medicine

## 2017-08-21 VITALS — BP 115/71 | HR 77 | Ht 68.0 in | Wt 306.0 lb

## 2017-08-21 DIAGNOSIS — E1169 Type 2 diabetes mellitus with other specified complication: Secondary | ICD-10-CM

## 2017-08-21 DIAGNOSIS — E1165 Type 2 diabetes mellitus with hyperglycemia: Secondary | ICD-10-CM

## 2017-08-21 DIAGNOSIS — M7522 Bicipital tendinitis, left shoulder: Secondary | ICD-10-CM | POA: Diagnosis not present

## 2017-08-21 DIAGNOSIS — E782 Mixed hyperlipidemia: Secondary | ICD-10-CM

## 2017-08-21 DIAGNOSIS — M25512 Pain in left shoulder: Secondary | ICD-10-CM

## 2017-08-21 DIAGNOSIS — I10 Essential (primary) hypertension: Secondary | ICD-10-CM | POA: Diagnosis not present

## 2017-08-21 DIAGNOSIS — R5382 Chronic fatigue, unspecified: Secondary | ICD-10-CM

## 2017-08-21 DIAGNOSIS — R748 Abnormal levels of other serum enzymes: Secondary | ICD-10-CM

## 2017-08-21 LAB — POCT GLYCOSYLATED HEMOGLOBIN (HGB A1C): Hemoglobin A1C: 7.5 % — AB (ref 4.0–5.6)

## 2017-08-21 MED ORDER — LISINOPRIL 5 MG PO TABS: 5.0000 mg | ORAL_TABLET | Freq: Every day | ORAL | 1 refills | Status: DC

## 2017-08-21 MED ORDER — CLONAZEPAM 0.5 MG PO TABS: 0.5000 mg | ORAL_TABLET | Freq: Two times a day (BID) | ORAL | 1 refills | Status: DC

## 2017-08-21 NOTE — Progress Notes (Signed)
Russell Ramos is a 42 y.o. male who [facility] today for left shoulder pain, diabetes hypertension morbid obesity.  Left shoulder pain: Russell Ramos was seen in February for left shoulder pain.  He received a subacromial injection that did not provide immediate pain relief.  He was thought to have biceps tendinitis or potential labrum tear.  He had a trial of home exercise program and notes that he is continuing to have bothersome pain interferes with his activity.  He does note some popping and clicking as well.  He is tried some over-the-counter medications which have helped only a little.  Diabetes: Patient takes medication as below.  Tolerates them well.  He does not check his blood sugar regularly.  He does try to eat a careful diet low in carbohydrates.  He denies any polyuria polydipsia or hypoglycemic episodes.  Hypertension: Patient takes medications below no chest pain palpitations shortness of breath lightheadedness or dizziness.  Anxiety: Patient uses medication below.  He notes Klonopin has been very helpful he notes that he has not had very many panic attacks at all.  He feels pretty satisfied with how things are going and if he like a refill of Klonopin if possible today  Obesity:Thos continues to struggle with obesity.  He managed to lose down from 450 pounds down to around 300 pounds in the past but has been stuck at around 3 to pounds.  He struggles with that last 50 to 60 pounds of weight loss.  He is interested in pursuing gastric bypass surgery and would like a referral if possible.   ROS as above:  Exam:  BP 115/71   Pulse 77   Ht 5\' 8"  (1.727 m)   Wt (!) 306 lb (138.8 kg)   BMI 46.53 kg/m   Wt Readings from Last 5 Encounters:  08/21/17 (!) 306 lb (138.8 kg)  03/29/17 (!) 305 lb (138.3 kg)  03/26/17 (!) 310 lb (140.6 kg)  11/29/16  295 lb (133.8 kg)  10/28/16 290 lb (131.5 kg)    Gen: Well NAD HEENT: EOMI,  MMM Lungs: Normal work of breathing. CTABL Heart: RRR no MRG Abd: NABS, Soft. Nondistended, Nontender Exts: Brisk capillary refill, warm and well perfused.  Left shoulder normal-appearing normal motion pain with abduction. Tender palpation bicipital groove. Positive Hawkins and Neer's test. Positive Yergason's and speeds test. Positive O'Brien's test. Intact strength. Pulses cap refill and sensation are intact.  Lab and Radiology Results Results for orders placed or performed in visit on 08/21/17 (from the past 72 hour(s))  POCT HgB A1C     Status: Abnormal   Collection Time: 08/21/17 11:33 AM  Result Value Ref Range   Hemoglobin A1C 7.5 (A) 4.0 - 5.6 %   HbA1c POC (<> result, manual entry)  4.0 - 5.6 %   HbA1c, POC (prediabetic range)  5.7 - 6.4 %   HbA1c, POC (controlled diabetic range)  0.0 - 7.0 %   No results found.    Assessment and Plan: 42 y.o. male with  Left shoulder pain: Likely multifactorial.  Concern for labrum tear as well as bicipital tendinitis.  Plan for MRI arthrogram and recheck after MRI.  Diabetes: Reasonably well controlled continue current regimen recheck 3 months.  Hypertension: Doing well continue current regimen.  Anxiety doing well refill Klonopin.  Morbid obesity: Referral [facility] for bariatric surgery evaluation consultation ordered today.   Orders Placed This  Encounter  Procedures  . MR SHOULDER LEFT W CONTRAST    MRI arthrogram left shoulder.    Standing Status:   Future    Standing Expiration Date:   10/23/2018    Scheduling Instructions:     Schedule with Dr Denyse Amass 1 hour prior to MRI    Order Specific Question:   If indicated for the ordered procedure, I authorize the administration of contrast media per Radiology protocol    Answer:   Yes    Order Specific Question:   What is the patient's sedation requirement?    Answer:   No  Sedation    Order Specific Question:   Does the patient have a pacemaker or implanted devices?    Answer:   No    Order Specific Question:   Radiology Contrast Protocol - do NOT remove file path    Answer:   [email or web address]    Order Specific Question:   Preferred imaging location?    Answer:   [facility] (table limit-350lbs)  . Ambulatory referral to General Surgery    Referral Priority:   Routine    Referral Type:   Surgical    Referral Reason:   Specialty Services Required    Requested Specialty:   General Surgery    Number of Visits Requested:   1  . POCT HgB A1C   Meds ordered this encounter  Medications  . clonazePAM (KLONOPIN) 0.5 MG tablet    Sig: Take 1 tablet (0.5 mg total) by mouth 2 (two) times daily.    Dispense:  180 tablet    Refill:  1    This request is for a new prescription for a controlled substance as required by Federal/State law.  . lisinopril (PRINIVIL,ZESTRIL) 5 MG tablet    Sig: Take 1 tablet (5 mg total) by mouth daily.    Dispense:  90 tablet    Refill:  1     Historical information moved to improve visibility of documentation.  Past Medical History:  Diagnosis Date  . Diabetes mellitus without complication (HCC)   . Hypertension   . Pulmonary embolism (HCC)   . Pulmonary hypertension (HCC)    Past Surgical History:  Procedure Laterality Date  . KNEE SURGERY Left    Social History   Tobacco Use  . Smoking status: Former Smoker    Packs/day: 1.00    Types: Cigarettes    Last attempt to quit: 09/12/2015    Years since quitting: 1.9  . Smokeless tobacco: Never Used  Substance Use Topics  . Alcohol use: Yes    Comment: socially   family history includes Diabetes in his mother.  Medications: Current Outpatient Medications  Medication Sig Dispense Refill  . AMBULATORY NON FORMULARY MEDICATION Single glucometer with lancets, test strips. Use glucometer of choice Test daily.  Uncontrolled  Diabetes E11.9 1 each 0  . AMBULATORY NON FORMULARY MEDICATION Continuous positive airway pressure (CPAP) machine auto titrate to a max pressure of 15 cm of H2O pressure, with all supplemental supplies as needed. AHI 13.3 1 each 0  . AMBULATORY NON FORMULARY MEDICATION 3ml syringe, 21g 1.5 inch needles and 18 gauge blunt filler needles for testosterone injections every 2 weeks. Disp qs x 3 months. 1 each 0  . atorvastatin (LIPITOR) 80 MG tablet Take 1 tablet (80 mg total) by mouth daily. 90 tablet 1  . clonazePAM (KLONOPIN) 0.5 MG tablet Take 1 tablet (0.5 mg total) by mouth 2 (two) times daily. 180 tablet 1  .  diclofenac sodium (VOLTAREN) 1 % GEL Apply 2 g topically 4 (four) times daily. To affected joint. 100 g 11  . empagliflozin (JARDIANCE) 25 MG TABS tablet Take 25 mg by mouth daily. 90 tablet 3  . lisinopril (PRINIVIL,ZESTRIL) 5 MG tablet Take 1 tablet (5 mg total) by mouth daily. 90 tablet 1  . omeprazole (PRILOSEC) 40 MG capsule TAKE 1 CAPSULE (40 MG TOTAL) BY MOUTH DAILY. 90 capsule 3  . SAFETY-LOK 3CC SYR 22GX1.5" 22G X 1-1/2" 3 ML MISC TO BE USED TO DRAW TESTOSTERONE FOR INJECTION 6 each 0  . TRULICITY 1.5 MG/0.5ML SOPN INJECT 1.5 MG INTO THE SKIN ONCE A WEEK. 6 pen 6   No current facility-administered medications for this visit.    No Known Allergies   Discussed warning signs or symptoms. Please see discharge instructions. Patient expresses understanding.

## 2017-08-21 NOTE — Patient Instructions (Addendum)
Thank you for coming in today. Continue medicine.  Schedule with me 1 hour prior to MRI for injection for the shoulder.

## 2017-08-21 NOTE — Progress Notes (Signed)
poct

## 2017-09-02 ENCOUNTER — Ambulatory Visit: Payer: BLUE CROSS/BLUE SHIELD

## 2017-09-02 ENCOUNTER — Ambulatory Visit: Payer: BLUE CROSS/BLUE SHIELD | Admitting: Family Medicine

## 2017-09-02 ENCOUNTER — Encounter: Payer: Self-pay | Admitting: Family Medicine

## 2017-09-02 VITALS — BP 114/83 | HR 67 | Wt 306.0 lb

## 2017-09-02 DIAGNOSIS — M7522 Bicipital tendinitis, left shoulder: Secondary | ICD-10-CM

## 2017-09-02 DIAGNOSIS — M25512 Pain in left shoulder: Secondary | ICD-10-CM

## 2017-09-02 NOTE — Patient Instructions (Signed)
Thank you for coming in today.   Call or go to the ER if you develop a large red swollen joint with extreme pain or oozing puss.   

## 2017-09-02 NOTE — Progress Notes (Signed)
    Patient presents to clinic for  previously scheduled gadolinium interarticular contrast.  Procedure: Real-time Ultrasound Guided Injection of left GH joint  Device: GE Logiq E  Images permanently stored and available for review in the ultrasound unit. Verbal informed consent obtained. Discussed risks and benefits of procedure. Warned about infection bleeding damage to structures skin hypopigmentation and fat atrophy among others. Patient expresses understanding and agreement Time-out conducted.  Noted no overlying erythema, induration, or other signs of local infection.  Skin prepped in a sterile fashion.  Local anesthesia: Topical Ethyl chloride.   With sterile technique and under real time ultrasound guidance:Spinal needle inserted and not visible on ultrasound. Needle withdrew and site cleaned and needle reinserted.  This time it was clearly visible on ultrasound and 5 mL of Marcaine were injected.  Syringe exchanged and 0.5 mL of gadolinium injected and the syringe was exchanged and then 7 mL of sterile saline injected easily.  Completed without difficulty    Advised to call if fevers/chills, erythema, induration, drainage, or persistent bleeding.  Images permanently stored and available for review in the ultrasound unit.  Impression: Technically successful ultrasound guided injection.   Unfortunately patient had a panic attack as soon as he got in the MRI scanner.  He thought that Klonopin would not be sufficient to control his anxiety inside of an MRI scanner.  This is the first time is ever had a problem with MRI.  Plan to cancel today's MRI and reschedule MRI arthrogram with moderate sedation in the near future.

## 2017-09-05 ENCOUNTER — Telehealth: Payer: Self-pay

## 2017-09-05 NOTE — Telephone Encounter (Signed)
Completed H&P form was faxed to Centralized Scheduling concerning the [place] Moderate Sedation Protocol.   Confirmation received. Copy sent to scan to chart and I also have copy filed in case of needs.

## 2017-09-25 ENCOUNTER — Other Ambulatory Visit: Payer: Self-pay | Admitting: Family Medicine

## 2017-09-25 DIAGNOSIS — E1165 Type 2 diabetes mellitus with hyperglycemia: Secondary | ICD-10-CM

## 2017-09-30 ENCOUNTER — Other Ambulatory Visit: Payer: Self-pay

## 2017-09-30 ENCOUNTER — Encounter (HOSPITAL_COMMUNITY): Payer: Self-pay | Admitting: *Deleted

## 2017-09-30 NOTE — Progress Notes (Signed)
SDW-Pre-op call completed by pt spouse, Revonda. Spouse denies that pt C/O SOB and chest pain. Spouse denies that pt is under the care of a cardiologist. Spouse denies that pt had a stress test and cardiac cath. Spouse denies that pt had a chest x ray and EKG within the last year. Pt spouse denies that pt had recent labs. Requested echo (09/22/14), LOV note and any cardiac studies [facility] [place]. Spouse made aware to have pt stop taking vitamins, herbal medications and Garlic. Spouse made aware to have pt not take Jardiance and Trulicity on DOS. Spouse made aware to have pt check BG every 2 hours prior to arrival to hospital on DOS. Spouse made aware to treat a BG < 70 with 4 ounces of apple or cranberry juice, wait 15 minutes after intervention to recheck BG, if BG remains < 70, call Short Stay unit to speak with a nurse. Spouse verbalized understanding of all pre-op instructions.

## 2017-10-01 ENCOUNTER — Ambulatory Visit (HOSPITAL_COMMUNITY)
Admission: RE | Admit: 2017-10-01 | Discharge: 2017-10-01 | Disposition: A | Discharge status: Home / Self Care | Payer: BLUE CROSS/BLUE SHIELD | Source: Ambulatory Visit | Attending: Family Medicine | Admitting: Family Medicine

## 2017-10-01 ENCOUNTER — Ambulatory Visit (HOSPITAL_COMMUNITY): Payer: BLUE CROSS/BLUE SHIELD | Admitting: Certified Registered Nurse Anesthetist

## 2017-10-01 ENCOUNTER — Encounter (HOSPITAL_COMMUNITY): Payer: Self-pay | Admitting: Certified Registered Nurse Anesthetist

## 2017-10-01 ENCOUNTER — Encounter (HOSPITAL_COMMUNITY)
Admission: RE | Disposition: A | Discharge status: Home / Self Care | Payer: Self-pay | Source: Ambulatory Visit | Attending: Family Medicine

## 2017-10-01 DIAGNOSIS — Z79899 Other long term (current) drug therapy: Secondary | ICD-10-CM | POA: Insufficient documentation

## 2017-10-01 DIAGNOSIS — Z86711 Personal history of pulmonary embolism: Secondary | ICD-10-CM | POA: Insufficient documentation

## 2017-10-01 DIAGNOSIS — I10 Essential (primary) hypertension: Secondary | ICD-10-CM | POA: Diagnosis not present

## 2017-10-01 DIAGNOSIS — G4733 Obstructive sleep apnea (adult) (pediatric): Secondary | ICD-10-CM | POA: Diagnosis not present

## 2017-10-01 DIAGNOSIS — X58XXXA Exposure to other specified factors, initial encounter: Secondary | ICD-10-CM | POA: Insufficient documentation

## 2017-10-01 DIAGNOSIS — S43431A Superior glenoid labrum lesion of right shoulder, initial encounter: Secondary | ICD-10-CM | POA: Diagnosis not present

## 2017-10-01 DIAGNOSIS — Z6841 Body Mass Index (BMI) 40.0 and over, adult: Secondary | ICD-10-CM | POA: Insufficient documentation

## 2017-10-01 DIAGNOSIS — Z87891 Personal history of nicotine dependence: Secondary | ICD-10-CM | POA: Insufficient documentation

## 2017-10-01 DIAGNOSIS — M25512 Pain in left shoulder: Secondary | ICD-10-CM | POA: Diagnosis present

## 2017-10-01 DIAGNOSIS — E669 Obesity, unspecified: Secondary | ICD-10-CM | POA: Diagnosis not present

## 2017-10-01 DIAGNOSIS — E119 Type 2 diabetes mellitus without complications: Secondary | ICD-10-CM | POA: Diagnosis not present

## 2017-10-01 DIAGNOSIS — Z7984 Long term (current) use of oral hypoglycemic drugs: Secondary | ICD-10-CM | POA: Insufficient documentation

## 2017-10-01 DIAGNOSIS — M7522 Bicipital tendinitis, left shoulder: Secondary | ICD-10-CM

## 2017-10-01 HISTORY — DX: Pain in left shoulder: M25.512

## 2017-10-01 HISTORY — DX: Anxiety disorder, unspecified: F41.9

## 2017-10-01 HISTORY — DX: Sleep apnea, unspecified: G47.30

## 2017-10-01 HISTORY — DX: Other specified postprocedural states: Z98.890

## 2017-10-01 HISTORY — DX: Post-traumatic stress disorder, unspecified: F43.10

## 2017-10-01 HISTORY — DX: Nausea with vomiting, unspecified: R11.2

## 2017-10-01 HISTORY — PX: RADIOLOGY WITH ANESTHESIA: SHX6223

## 2017-10-01 HISTORY — DX: Other chronic pain: G89.29

## 2017-10-01 LAB — BASIC METABOLIC PANEL
Anion gap: 10 (ref 5–15)
BUN: 14 mg/dL (ref 6–20)
CO2: 24 mmol/L (ref 22–32)
CREATININE: 1.03 mg/dL (ref 0.61–1.24)
Calcium: 9 mg/dL (ref 8.9–10.3)
Chloride: 103 mmol/L (ref 98–111)
GFR calc Af Amer: >60 mL/min (ref >60)
GFR calc non Af Amer: >60 mL/min (ref >60)
GLUCOSE: 169 mg/dL — AB (ref 70–99)
POTASSIUM: 4.3 mmol/L (ref 3.5–5.1)
Sodium: 137 mmol/L (ref 135–145)

## 2017-10-01 LAB — CBC
HCT: 45.2 % (ref 39.0–52.0)
Hemoglobin: 14.6 g/dL (ref 13.0–17.0)
MCH: 30.5 pg (ref 26.0–34.0)
MCHC: 32.3 g/dL (ref 30.0–36.0)
MCV: 94.6 fL (ref 78.0–100.0)
Platelets: 374 10*3/uL (ref 150–400)
RBC: 4.78 MIL/uL (ref 4.22–5.81)
RDW: 12.2 % (ref 11.5–15.5)
WBC: 10 10*3/uL (ref 4.0–10.5)

## 2017-10-01 LAB — GLUCOSE, CAPILLARY
GLUCOSE-CAPILLARY: 180 mg/dL — AB (ref 70–99)
Glucose-Capillary: 147 mg/dL — ABNORMAL HIGH (ref 70–99)

## 2017-10-01 SURGERY — MRI WITH ANESTHESIA
Anesthesia: General | Laterality: Left

## 2017-10-01 MED ORDER — SODIUM CHLORIDE 0.9 % IJ SOLN
15.0000 mL | Freq: Once | INTRAMUSCULAR | Status: AC
  Administered 2017-10-01: 20 mL via INTRAVENOUS

## 2017-10-01 MED ORDER — LACTATED RINGERS IV SOLN
INTRAVENOUS | Status: DC
  Administered 2017-10-01: 10 mL/h via INTRAVENOUS

## 2017-10-01 MED ORDER — GADOBENATE DIMEGLUMINE 529 MG/ML IV SOLN
5.0000 mL | Freq: Once | INTRAVENOUS | Status: AC | PRN
  Administered 2017-10-01: 0.2 mL via INTRA_ARTICULAR

## 2017-10-01 MED ORDER — LIDOCAINE HCL (PF) 1 % IJ SOLN
INTRAMUSCULAR | Status: AC
  Administered 2017-10-01: 5 mL via INTRADERMAL
  Filled 2017-10-01: qty 5

## 2017-10-01 MED ORDER — IOPAMIDOL (ISOVUE-M 200) INJECTION 41%
INTRAMUSCULAR | Status: AC
  Administered 2017-10-01: 10 mL via INTRA_ARTICULAR
  Filled 2017-10-01: qty 10

## 2017-10-01 MED ORDER — LIDOCAINE HCL (PF) 1 % IJ SOLN
INTRAMUSCULAR | Status: AC
  Administered 2017-10-01: 5 mL via INTRADERMAL
  Filled 2017-10-01: qty 10

## 2017-10-01 MED ORDER — IOPAMIDOL (ISOVUE-M 200) INJECTION 41%
20.0000 mL | Freq: Once | INTRAMUSCULAR | Status: AC
  Administered 2017-10-01: 10 mL via INTRA_ARTICULAR

## 2017-10-01 MED ORDER — LIDOCAINE HCL (PF) 1 % IJ SOLN
5.0000 mL | Freq: Once | INTRAMUSCULAR | Status: AC
  Administered 2017-10-01: 5 mL via INTRADERMAL

## 2017-10-01 MED ORDER — SODIUM CHLORIDE 0.9 % IJ SOLN
INTRAMUSCULAR | Status: AC
  Administered 2017-10-01: 20 mL via INTRAVENOUS
  Filled 2017-10-01: qty 30

## 2017-10-01 NOTE — Transfer of Care (Signed)
Immediate Anesthesia Transfer of Care Note  Patient: Russell Ramos  Procedure(s) Performed: MRI OF LEFT SHOULDER WITH CONTRAST AND DG FLOURO GUIDE NEEDLE INJECTION (Left )  Patient Location: PACU  Anesthesia Type:General  Level of Consciousness: awake, alert  and oriented  Airway & Oxygen Therapy: Patient Spontanous Breathing and Patient connected to nasal cannula oxygen  Post-op Assessment: Report given to RN, Post -op Vital signs reviewed and stable and Patient moving all extremities  Post vital signs: Reviewed and stable  Last Vitals:  Vitals Value Taken Time  BP 129/79 10/01/2017  2:20 PM  Temp 36.5 C 10/01/2017  2:20 PM  Pulse 86 10/01/2017  2:27 PM  Resp 13 10/01/2017  2:27 PM  SpO2 94 % 10/01/2017  2:27 PM  Vitals shown include unvalidated device data.  Last Pain:  Vitals:   10/01/17 1420  TempSrc:   PainSc: 0-No pain      Patients Stated Pain Goal: 2 (10/01/17 0744)  Complications: No apparent anesthesia complications

## 2017-10-01 NOTE — Anesthesia Preprocedure Evaluation (Signed)
Anesthesia Evaluation  Patient identified by MRN, date of birth, ID band Patient awake    Reviewed: Allergy & Precautions, NPO status , Patient's Chart, lab work & pertinent test results  Airway Mallampati: II  TM Distance: >3 FB Neck ROM: Full    Dental  (+) Dental Advisory Given   Pulmonary sleep apnea , former smoker,    Pulmonary exam normal        Cardiovascular hypertension, Pt. on medications Normal cardiovascular exam     Neuro/Psych Anxiety negative neurological ROS     GI/Hepatic negative GI ROS, Neg liver ROS,   Endo/Other  diabetes, Type 2  Renal/GU negative Renal ROS     Musculoskeletal   Abdominal   Peds  Hematology negative hematology ROS (+)   Anesthesia Other Findings   Reproductive/Obstetrics                             Anesthesia Physical Anesthesia Plan  ASA: III  Anesthesia Plan: General   Post-op Pain Management:    Induction: Intravenous  PONV Risk Score and Plan: 2 and Ondansetron, Dexamethasone and Treatment may vary due to age or medical condition  Airway Management Planned: Oral ETT  Additional Equipment:   Intra-op Plan:   Post-operative Plan: Extubation in OR  Informed Consent: I have reviewed the patients History and Physical, chart, labs and discussed the procedure including the risks, benefits and alternatives for the proposed anesthesia with the patient or authorized representative who has indicated his/her understanding and acceptance.   Dental advisory given  Plan Discussed with: CRNA  Anesthesia Plan Comments:         Anesthesia Quick Evaluation

## 2017-10-02 ENCOUNTER — Encounter (HOSPITAL_COMMUNITY): Payer: Self-pay | Admitting: Radiology

## 2017-10-02 NOTE — Anesthesia Postprocedure Evaluation (Signed)
Anesthesia Post Note  Patient: Russell CurrentEugene Oriordan Ramos  Procedure(s) Performed: MRI OF LEFT SHOULDER WITH CONTRAST AND DG FLOURO GUIDE NEEDLE INJECTION (Left )     Patient location during evaluation: PACU Anesthesia Type: General Level of consciousness: awake and alert Pain management: pain level controlled Vital Signs Assessment: post-procedure vital signs reviewed and stable Respiratory status: spontaneous breathing, nonlabored ventilation, respiratory function stable and patient connected to nasal cannula oxygen Cardiovascular status: blood pressure returned to baseline and stable Postop Assessment: no apparent nausea or vomiting Anesthetic complications: no    Last Vitals:  Vitals:   10/01/17 1440 10/01/17 1450  BP: 130/88 (!) 130/91  Pulse: 80 72  Resp: 16 14  Temp:    SpO2: 92% 93%    Last Pain:  Vitals:   10/01/17 1450  TempSrc:   PainSc: 0-No pain                 Kennieth RadFitzgerald, Kayan Blissett E

## 2017-10-06 ENCOUNTER — Other Ambulatory Visit: Payer: Self-pay | Admitting: Family Medicine

## 2017-10-06 DIAGNOSIS — R5382 Chronic fatigue, unspecified: Secondary | ICD-10-CM

## 2017-10-06 DIAGNOSIS — I10 Essential (primary) hypertension: Secondary | ICD-10-CM

## 2017-10-06 DIAGNOSIS — E782 Mixed hyperlipidemia: Secondary | ICD-10-CM

## 2017-10-06 DIAGNOSIS — E1169 Type 2 diabetes mellitus with other specified complication: Secondary | ICD-10-CM

## 2017-10-06 DIAGNOSIS — R748 Abnormal levels of other serum enzymes: Secondary | ICD-10-CM

## 2017-10-06 DIAGNOSIS — E1165 Type 2 diabetes mellitus with hyperglycemia: Secondary | ICD-10-CM

## 2017-10-07 ENCOUNTER — Ambulatory Visit: Payer: BLUE CROSS/BLUE SHIELD | Admitting: Family Medicine

## 2017-10-07 ENCOUNTER — Encounter: Payer: Self-pay | Admitting: Family Medicine

## 2017-10-07 VITALS — BP 125/81 | HR 87 | Wt 305.0 lb

## 2017-10-07 DIAGNOSIS — S43432A Superior glenoid labrum lesion of left shoulder, initial encounter: Secondary | ICD-10-CM

## 2017-10-07 DIAGNOSIS — Z23 Encounter for immunization: Secondary | ICD-10-CM | POA: Diagnosis not present

## 2017-10-07 MED FILL — Midazolam HCl Inj 2 MG/2ML (Base Equivalent): INTRAMUSCULAR | Qty: 2 | Status: AC

## 2017-10-07 MED FILL — Fentanyl Citrate Preservative Free (PF) Inj 100 MCG/2ML: INTRAMUSCULAR | Qty: 2 | Status: AC

## 2017-10-07 MED FILL — Propofol IV Emul 500 MG/50ML (10 MG/ML): INTRAVENOUS | Qty: 50 | Status: AC

## 2017-10-07 MED FILL — Ondansetron HCl Inj 4 MG/2ML (2 MG/ML): INTRAMUSCULAR | Qty: 2 | Status: AC

## 2017-10-07 MED FILL — Lidocaine HCl(Cardiac) IV PF Soln Pref Syr 100 MG/5ML (2%): INTRAVENOUS | Qty: 5 | Status: AC

## 2017-10-07 MED FILL — Succinylcholine Chloride Sol Pref Syr 200 MG/10ML (20 MG/ML): INTRAVENOUS | Qty: 10 | Status: AC

## 2017-10-07 NOTE — Progress Notes (Signed)
Russell Ramos is a 42 y.o. male who [facility] [place] today for follow-up shoulder pain.  Russell Ramos was seen previously for acute pain in his left shoulder for months.  He has had trials of conservative management including home exercise program.  He had bothersome clicking and popping in his shoulder and after failing conservative management was thought to have a potential labrum tear.  He recently had an MRI arthrogram that showed a torn posterior superior labrum.  He notes chronic bothersome shoulder pain.   Patient notes that his blood sugars have been well controlled.  Additionally he notes he is able to walk up 2 flights of stairs without having any significant shortness of breath or chest pain.  ROS as above:  Exam:  BP 125/81   Pulse 87   Wt (!) 305 lb (138.3 kg)   BMI 45.70 kg/m  Gen: Well NAD  Left shoulder: Normal-appearing normal motion. Positive Hawkins and Neer's test.   Strength intact.  Diabetic Foot Exam - Simple   Simple Foot Form Diabetic Foot exam was performed with the following findings:  Yes 10/07/2017 11:49 AM  Visual Inspection No deformities, no ulcerations, no other skin breakdown bilaterally:  Yes Sensation Testing Intact to touch and monofilament testing bilaterally:  Yes Pulse Check Posterior Tibialis and Dorsalis pulse intact bilaterally:  Yes Comments       Assessment and Plan: 42 y.o. male with left shoulder pain likely due to labrum tear.  Patient does have some mechanical symptoms.  He is effectively failing conservative management and I think is reasonable to proceed with surgical planning.  Plan to refer to orthopedic surgery.  Patient has already had some preop evaluation as part of his anesthesia as part of his MRI arthrogram.  He had a unremarkable he the he and he has good exertional capacity.  Diabetes is  reasonably controlled as his blood pressure.  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  Not currently anticoagulated.  Influenza vaccine given today prior to discharge.  I spent 15 minutes with this patient, greater than 50% was face-to-face time counseling regarding ddx and plan.  Orders Placed This Encounter  Procedures  . Flu Vaccine QUAD 36+ mos IM    cunningham  . Ambulatory referral to Orthopedic Surgery    Referral Priority:   Routine    Referral Type:   Surgical    Referral Reason:   Specialty Services Required    Referred to Provider:   Teryl LucyLandau, Joshua, MD    Requested Specialty:   Orthopedic Surgery    Number of Visits Requested:   1   No orders of the defined types were placed in this encounter.    Historical information moved to improve visibility of documentation.  Past Medical History:  Diagnosis Date  . Anxiety   . Chronic left shoulder pain   . Diabetes mellitus without complication (HCC)   . Hypertension   . PONV (postoperative nausea and vomiting)   . PTSD (post-traumatic stress disorder)   . Pulmonary embolism (HCC)   . Pulmonary hypertension (HCC)   . Sleep apnea    does not use CPAP   Past Surgical History:  Procedure Laterality Date  . KNEE SURGERY Left   . RADIOLOGY WITH ANESTHESIA Left 10/01/2017   Procedure: MRI OF LEFT SHOULDER WITH CONTRAST AND DG FLOURO GUIDE NEEDLE INJECTION;  Surgeon: Radiologist, Medication, MD;  Location: [facility] OR;  Service: Radiology;  Laterality: Left;   Social History   Tobacco Use  . Smoking status: Former Smoker    Packs/day: 1.00    Types: Cigarettes    Last attempt to quit: 09/12/2015    Years since quitting: 2.0  . Smokeless tobacco: Never Used  Substance Use Topics  . Alcohol use: Yes    Comment: socially   family history includes Diabetes in his mother.  Medications: Current Outpatient Medications  Medication Sig Dispense Refill  . AMBULATORY NON FORMULARY MEDICATION  Single glucometer with lancets, test strips. Use glucometer of choice Test daily.  Uncontrolled Diabetes E11.9 1 each 0  . AMBULATORY NON FORMULARY MEDICATION Continuous positive airway pressure (CPAP) machine auto titrate to a max pressure of 15 cm of H2O pressure, with all supplemental supplies as needed. AHI 13.3 1 each 0  . AMBULATORY NON FORMULARY MEDICATION 3ml syringe, 21g 1.5 inch needles and 18 gauge blunt filler needles for testosterone injections every 2 weeks. Disp qs x 3 months. 1 each 0  . atorvastatin (LIPITOR) 80 MG tablet TAKE 1 TABLET BY MOUTH EVERY DAY 90 tablet 1  . cholecalciferol (VITAMIN D) 1000 units tablet Take 1,000 Units by mouth daily.    . clonazePAM (KLONOPIN) 0.5 MG tablet Take 1 tablet (0.5 mg total) by mouth 2 (two) times daily. (Patient taking differently: Take 0.5 mg by mouth 2 (two) times daily as needed for anxiety. ) 180 tablet 1  . JARDIANCE 25 MG TABS tablet TAKE 1 TABLET BY MOUTH EVERY DAY 90 tablet 3  . lisinopril (PRINIVIL,ZESTRIL) 5 MG tablet Take 1 tablet (5 mg total) by mouth daily. 90 tablet 1  . omeprazole (PRILOSEC) 40 MG capsule TAKE 1 CAPSULE (40 MG TOTAL) BY MOUTH DAILY. 90 capsule 3  . SAFETY-LOK 3CC SYR 22GX1.5" 22G X 1-1/2" 3 ML MISC TO BE USED TO DRAW 1ML TESTOSTERONE FOR INJECTION 6 each 0  . TRULICITY 1.5 MG/0.5ML SOPN INJECT 1.5 MG INTO THE SKIN ONCE A WEEK. 6 pen 6   No current facility-administered medications for this visit.    No Known Allergies   Discussed warning signs or symptoms. Please see discharge instructions. Patient expresses understanding.

## 2017-10-07 NOTE — Patient Instructions (Addendum)
You should hear about surgeon referral soon.  Let me know if you do not hear something soon.   Recheck with me as scheduled.  Return sooner if needed.

## 2017-12-24 ENCOUNTER — Other Ambulatory Visit: Payer: Self-pay | Admitting: Family Medicine

## 2017-12-24 DIAGNOSIS — E1165 Type 2 diabetes mellitus with hyperglycemia: Secondary | ICD-10-CM

## 2017-12-24 DIAGNOSIS — R748 Abnormal levels of other serum enzymes: Secondary | ICD-10-CM

## 2017-12-24 DIAGNOSIS — E1169 Type 2 diabetes mellitus with other specified complication: Secondary | ICD-10-CM

## 2017-12-24 DIAGNOSIS — E782 Mixed hyperlipidemia: Secondary | ICD-10-CM

## 2017-12-24 DIAGNOSIS — I10 Essential (primary) hypertension: Secondary | ICD-10-CM

## 2017-12-24 DIAGNOSIS — R5382 Chronic fatigue, unspecified: Secondary | ICD-10-CM

## 2018-01-15 ENCOUNTER — Other Ambulatory Visit: Payer: Self-pay

## 2018-01-15 DIAGNOSIS — E1169 Type 2 diabetes mellitus with other specified complication: Secondary | ICD-10-CM

## 2018-01-15 DIAGNOSIS — E1165 Type 2 diabetes mellitus with hyperglycemia: Secondary | ICD-10-CM

## 2018-01-15 DIAGNOSIS — E782 Mixed hyperlipidemia: Secondary | ICD-10-CM

## 2018-01-15 MED ORDER — AMBULATORY NON FORMULARY MEDICATION: 0 refills | Status: DC

## 2018-01-29 ENCOUNTER — Other Ambulatory Visit: Payer: Self-pay | Admitting: Family Medicine

## 2018-01-29 DIAGNOSIS — I10 Essential (primary) hypertension: Secondary | ICD-10-CM

## 2018-01-29 DIAGNOSIS — E1169 Type 2 diabetes mellitus with other specified complication: Secondary | ICD-10-CM

## 2018-01-29 DIAGNOSIS — E782 Mixed hyperlipidemia: Secondary | ICD-10-CM

## 2018-01-29 DIAGNOSIS — E1165 Type 2 diabetes mellitus with hyperglycemia: Secondary | ICD-10-CM

## 2018-01-29 DIAGNOSIS — R748 Abnormal levels of other serum enzymes: Secondary | ICD-10-CM

## 2018-01-29 DIAGNOSIS — R5382 Chronic fatigue, unspecified: Secondary | ICD-10-CM

## 2018-02-20 IMAGING — DX DG CHEST 2V
2 series · 2 of 2 positions shown · non-contrast
Comparison: None.

CLINICAL DATA: Lightheadedness for a few days.

EXAM:
CHEST  2 VIEW

[chest pa]
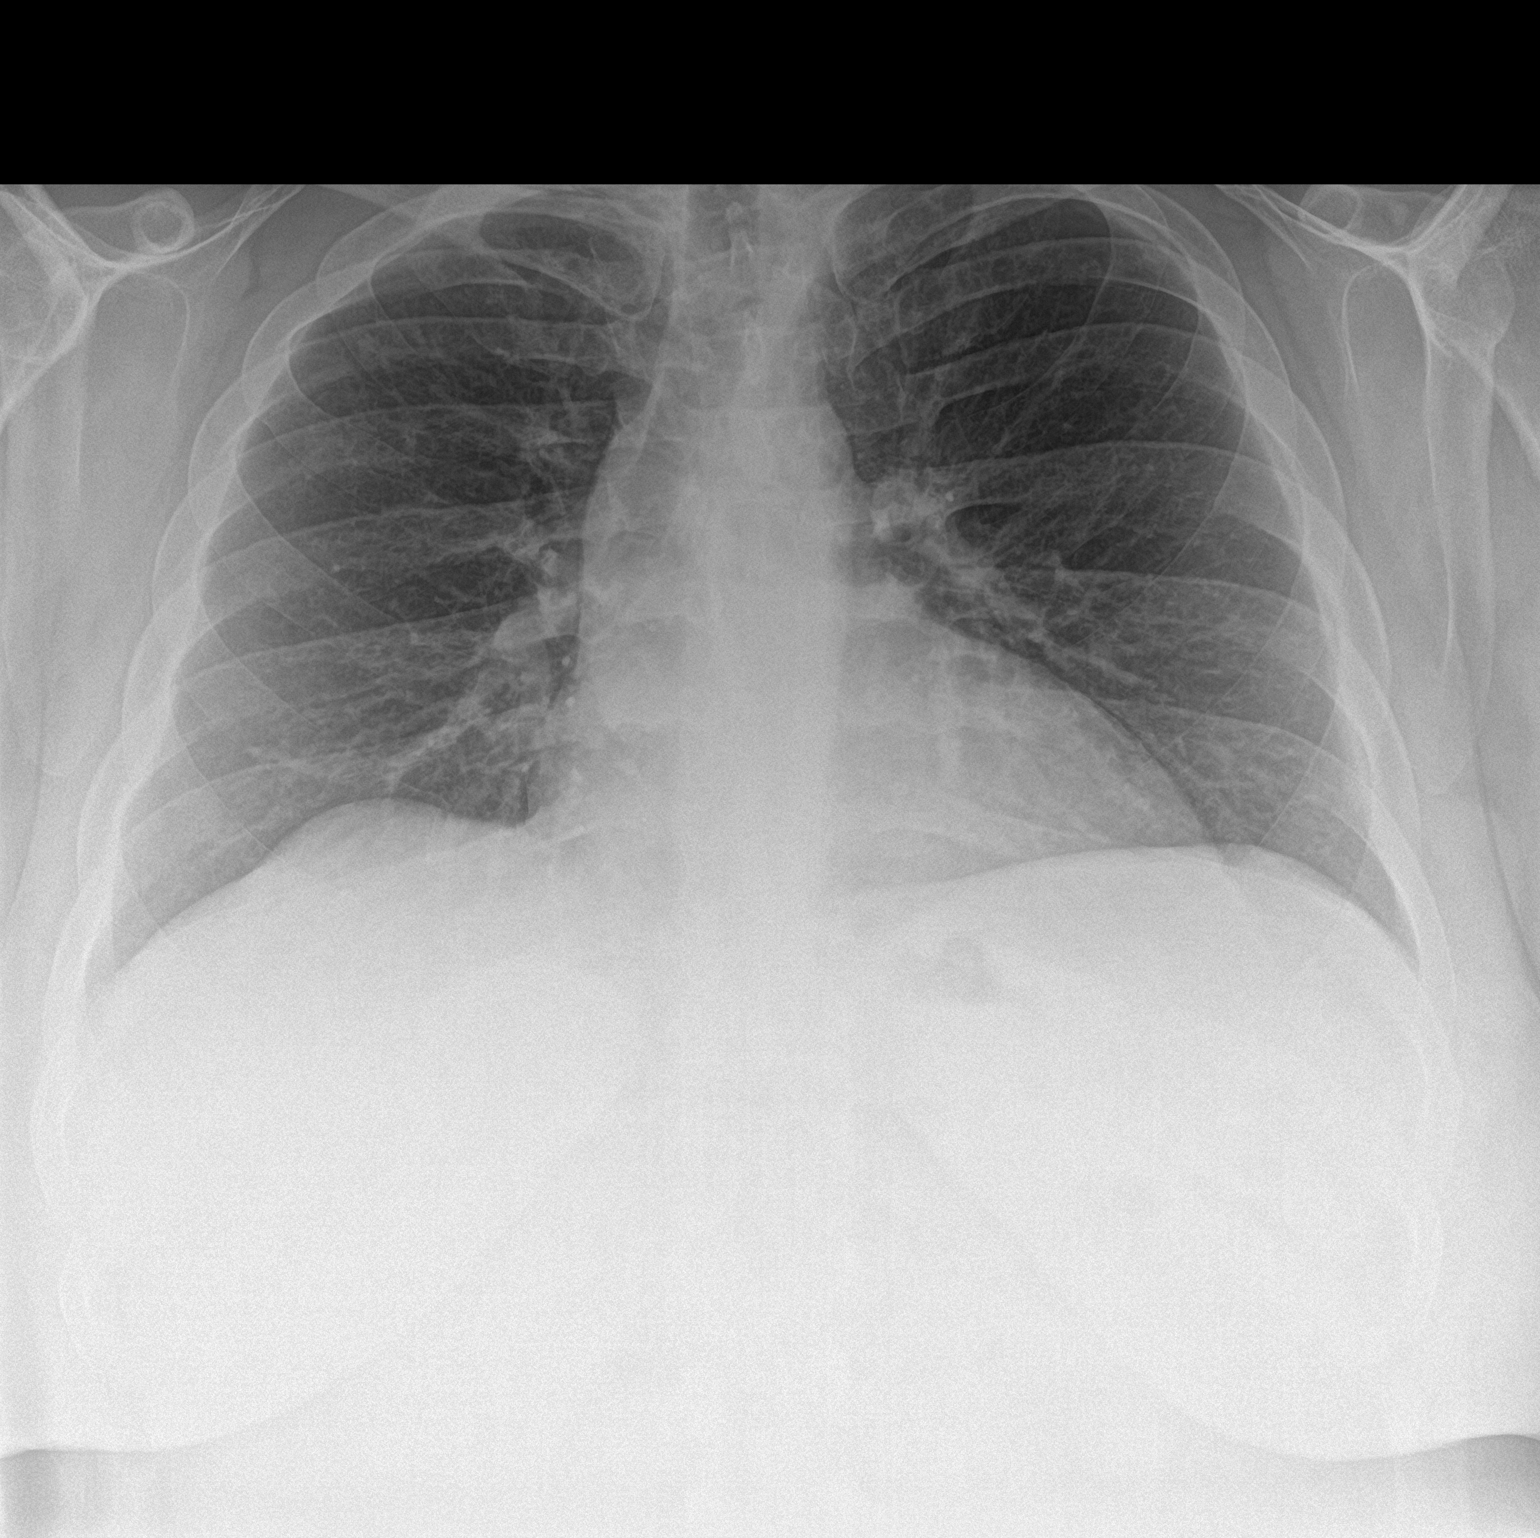

[chest lat]
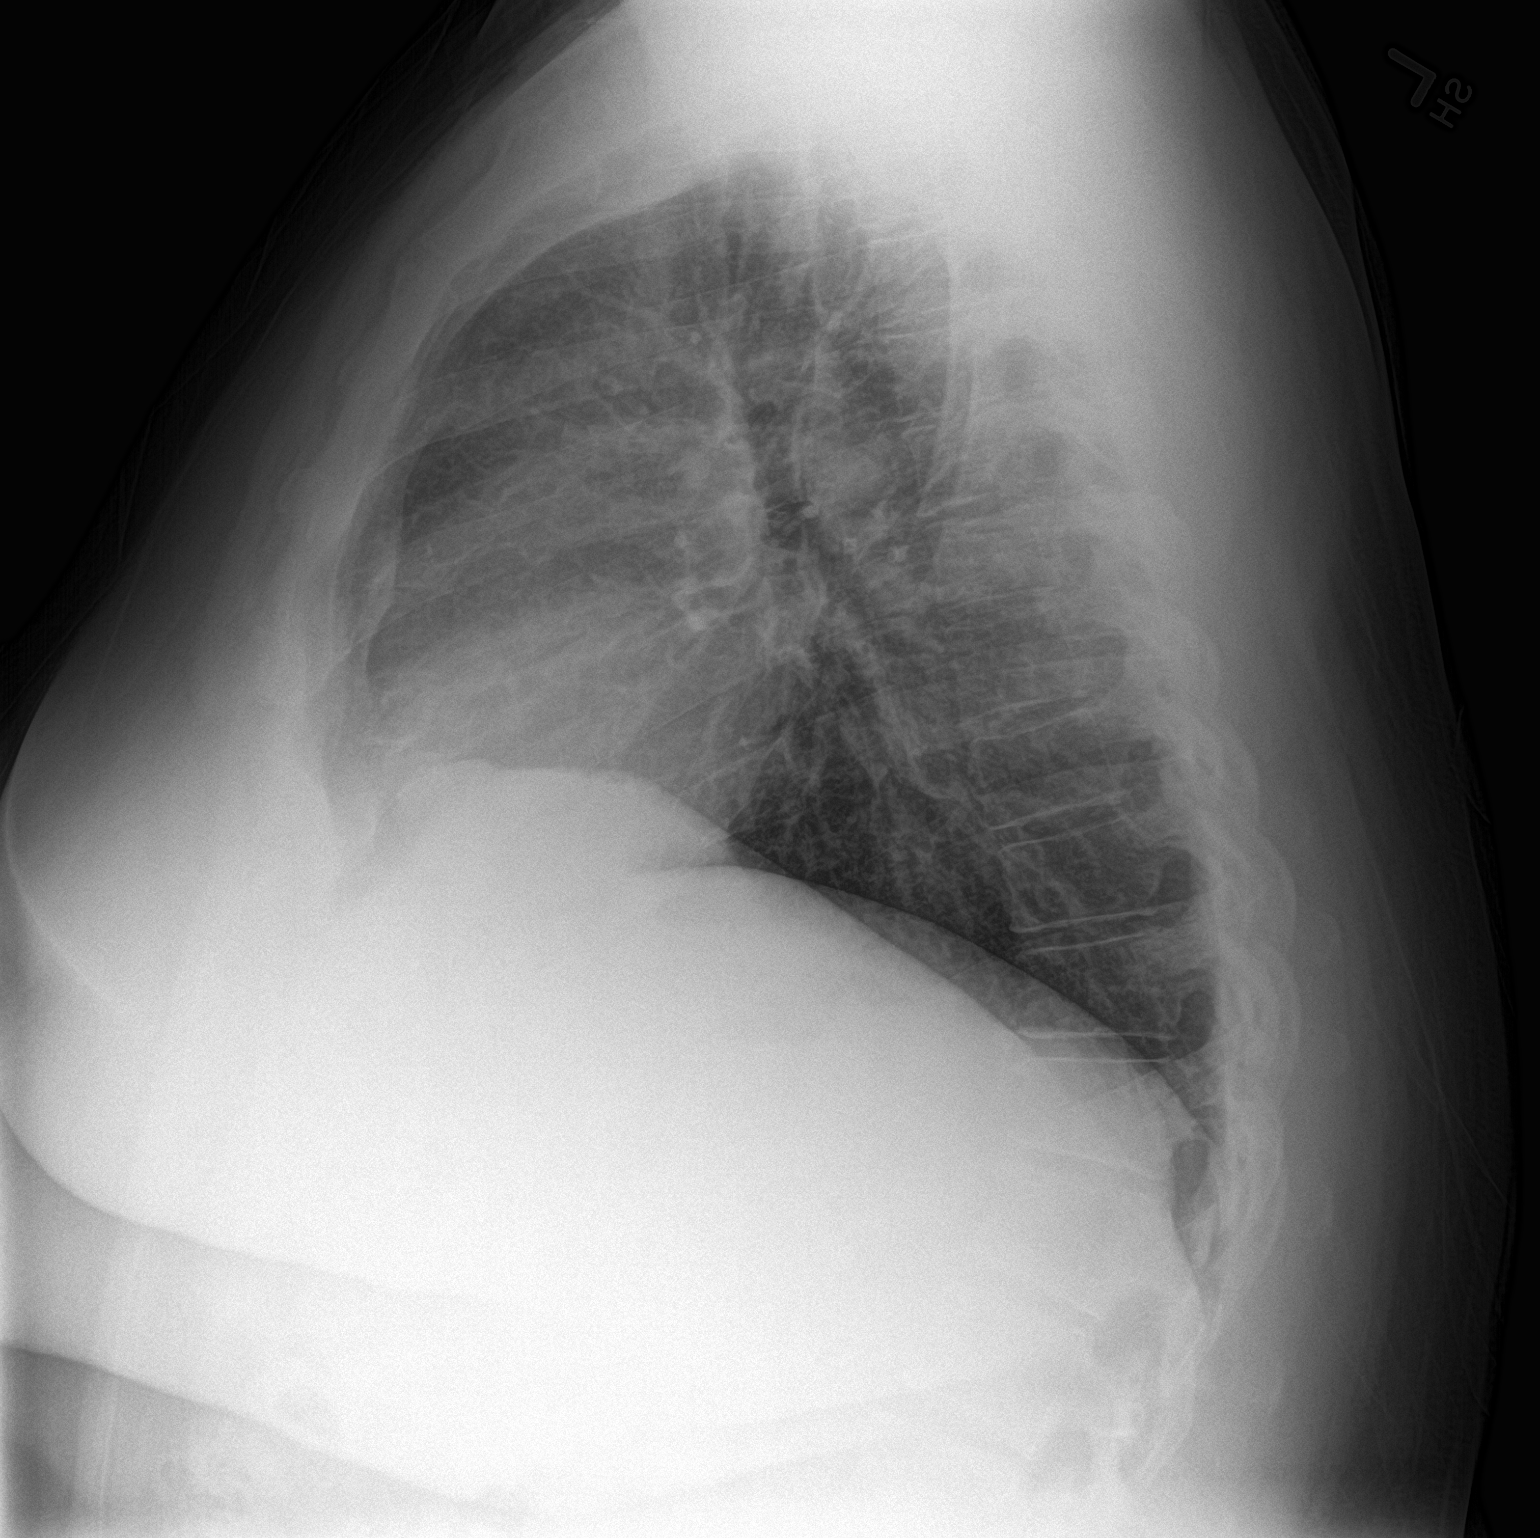

[2 of 2 positions shown; findings below may reference images not displayed]

FINDINGS: The heart size and mediastinal contours are within normal limits.
Both lungs are clear. The visualized skeletal structures are
unremarkable.
IMPRESSION: No active cardiopulmonary disease.

## 2018-03-18 ENCOUNTER — Encounter: Payer: Self-pay | Admitting: Family Medicine

## 2018-03-18 ENCOUNTER — Ambulatory Visit: Payer: BLUE CROSS/BLUE SHIELD | Admitting: Family Medicine

## 2018-03-18 VITALS — BP 136/77 | HR 66 | Ht 68.5 in | Wt 306.0 lb

## 2018-03-18 DIAGNOSIS — G4733 Obstructive sleep apnea (adult) (pediatric): Secondary | ICD-10-CM | POA: Diagnosis not present

## 2018-03-18 DIAGNOSIS — E1169 Type 2 diabetes mellitus with other specified complication: Secondary | ICD-10-CM

## 2018-03-18 DIAGNOSIS — E1165 Type 2 diabetes mellitus with hyperglycemia: Secondary | ICD-10-CM | POA: Diagnosis not present

## 2018-03-18 DIAGNOSIS — I272 Pulmonary hypertension, unspecified: Secondary | ICD-10-CM

## 2018-03-18 DIAGNOSIS — F410 Panic disorder [episodic paroxysmal anxiety] without agoraphobia: Secondary | ICD-10-CM

## 2018-03-18 DIAGNOSIS — I10 Essential (primary) hypertension: Secondary | ICD-10-CM

## 2018-03-18 DIAGNOSIS — E1159 Type 2 diabetes mellitus with other circulatory complications: Secondary | ICD-10-CM

## 2018-03-18 DIAGNOSIS — I152 Hypertension secondary to endocrine disorders: Secondary | ICD-10-CM | POA: Insufficient documentation

## 2018-03-18 DIAGNOSIS — E782 Mixed hyperlipidemia: Secondary | ICD-10-CM

## 2018-03-18 LAB — POCT GLYCOSYLATED HEMOGLOBIN (HGB A1C): Hemoglobin A1C: 7.5 % — AB (ref 4.0–5.6)

## 2018-03-18 MED ORDER — CLONAZEPAM 1 MG PO TABS: 0.5000 mg | ORAL_TABLET | Freq: Two times a day (BID) | ORAL | 1 refills | Status: DC

## 2018-03-18 MED ORDER — PROPRANOLOL HCL 20 MG PO TABS: 20.0000 mg | ORAL_TABLET | Freq: Four times a day (QID) | ORAL | 1 refills | Status: DC | PRN

## 2018-03-18 MED ORDER — DULOXETINE HCL 30 MG PO CPEP: 30.0000 mg | ORAL_CAPSULE | Freq: Every day | ORAL | 1 refills | Status: DC

## 2018-03-18 NOTE — Patient Instructions (Addendum)
Thank you for coming in today. See changed dose of klonoipin.   Start cymbalta.   Use propranolol as needed for panic attacks or breakthrough anxiety.   Get labs today.   Recheck with me in 2-4 weeks.   I think the mood clinic is a great idea.    I will send notes to your psychiatrist.   Duloxetine delayed-release capsules What is this medicine? DULOXETINE (doo LOX e teen) is used to treat depression, anxiety, and different types of chronic pain. This medicine may be used for other purposes; ask your health care provider or pharmacist if you have questions. COMMON BRAND NAME(S): Cymbalta, [place] What should I tell my health care provider before I take this medicine? They need to know if you have any of these conditions: -bipolar disorder or a family history of bipolar disorder -glaucoma -kidney disease -liver disease -suicidal thoughts or a previous suicide attempt -taken medicines called MAOIs like Carbex, Eldepryl, Marplan, Nardil, and Parnate within 14 days -an unusual reaction to duloxetine, other medicines, foods, dyes, or preservatives -pregnant or trying to get pregnant -breast-feeding How should I use this medicine? Take this medicine by mouth with a glass of water. Follow the directions on the prescription label. Do not cut, crush or chew this medicine. You can take this medicine with or without food. Take your medicine at regular intervals. Do not take your medicine more often than directed. Do not stop taking this medicine suddenly except upon the advice of your doctor. Stopping this medicine too quickly may cause serious side effects or your condition may worsen. A special MedGuide will be given to you by the pharmacist with each prescription and refill. Be sure to read this information carefully each time. Talk to your pediatrician regarding the use of this medicine in children. While this drug may be prescribed for children as young as 43 years of age for selected  conditions, precautions do apply. Overdosage: If you think you have taken too much of this medicine contact a poison control center or emergency room at once. NOTE: This medicine is only for you. Do not share this medicine with others. What if I miss a dose? If you miss a dose, take it as soon as you can. If it is almost time for your next dose, take only that dose. Do not take double or extra doses. What may interact with this medicine? Do not take this medicine with any of the following medications: -desvenlafaxine -levomilnacipran -linezolid -MAOIs like Carbex, Eldepryl, Marplan, Nardil, and Parnate -methylene blue (injected into a vein) -milnacipran -thioridazine -venlafaxine This medicine may also interact with the following medications: -alcohol -amphetamines -aspirin and aspirin-like medicines -certain antibiotics like ciprofloxacin and enoxacin -certain medicines for blood pressure, heart disease, irregular heart beat -certain medicines for depression, anxiety, or psychotic disturbances -certain medicines for migraine headache like almotriptan, eletriptan, frovatriptan, naratriptan, rizatriptan, sumatriptan, zolmitriptan -certain medicines that treat or prevent blood clots like warfarin, enoxaparin, and dalteparin -cimetidine -fentanyl -lithium -NSAIDS, medicines for pain and inflammation, like ibuprofen or naproxen -phentermine -procarbazine -rasagiline -sibutramine -St. John's wort -theophylline -tramadol -tryptophan This list may not describe all possible interactions. Give your health care provider a list of all the medicines, herbs, non-prescription drugs, or dietary supplements you use. Also tell them if you smoke, drink alcohol, or use illegal drugs. Some items may interact with your medicine. What should I watch for while using this medicine? Tell your doctor if your symptoms do not get better or if they get worse.  Visit your doctor or health care professional  for regular checks on your progress. Because it may take several weeks to see the full effects of this medicine, it is important to continue your treatment as prescribed by your doctor. Patients and their families should watch out for new or worsening thoughts of suicide or depression. Also watch out for sudden changes in feelings such as feeling anxious, agitated, panicky, irritable, hostile, aggressive, impulsive, severely restless, overly excited and hyperactive, or not being able to sleep. If this happens, especially at the beginning of treatment or after a change in dose, call your health care professional. Bonita QuinYou may get drowsy or dizzy. Do not drive, use machinery, or do anything that needs mental alertness until you know how this medicine affects you. Do not stand or sit up quickly, especially if you are an older patient. This reduces the risk of dizzy or fainting spells. Alcohol may interfere with the effect of this medicine. Avoid alcoholic drinks. This medicine can cause an increase in blood pressure. This medicine can also cause a sudden drop in your blood pressure, which may make you feel faint and increase the chance of a fall. These effects are most common when you first start the medicine or when the dose is increased, or during use of other medicines that can cause a sudden drop in blood pressure. Check with your doctor for instructions on monitoring your blood pressure while taking this medicine. Your mouth may get dry. Chewing sugarless gum or sucking hard candy, and drinking plenty of water may help. Contact your doctor if the problem does not go away or is severe. What side effects may I notice from receiving this medicine? Side effects that you should report to your doctor or health care professional as soon as possible: -allergic reactions like skin rash, itching or hives, swelling of the face, lips, or tongue -anxious -breathing problems -confusion -changes in vision -chest  pain -confusion -elevated mood, decreased need for sleep, racing thoughts, impulsive behavior -eye pain -fast, irregular heartbeat -feeling faint or lightheaded, falls -feeling agitated, angry, or irritable -hallucination, loss of contact with reality -high blood pressure -loss of balance or coordination -palpitations -redness, blistering, peeling or loosening of the skin, including inside the mouth -restlessness, pacing, inability to keep still -seizures -stiff muscles -suicidal thoughts or other mood changes -trouble passing urine or change in the amount of urine -trouble sleeping -unusual bleeding or bruising -unusually weak or tired -vomiting -yellowing of the eyes or skin Side effects that usually do not require medical attention (report to your doctor or health care professional if they continue or are bothersome): -change in sex drive or performance -change in appetite or weight -constipation -dizziness -dry mouth -headache -increased sweating -nausea -tired This list may not describe all possible side effects. Call your doctor for medical advice about side effects. You may report side effects to FDA at 1-800-FDA-1088. Where should I keep my medicine? Keep out of the reach of children. Store at room temperature between 20 and 25 degrees C (68 to 77 degrees F). Throw away any unused medicine after the expiration date. NOTE: This sheet is a summary. It may not cover all possible information. If you have questions about this medicine, talk to your doctor, pharmacist, or health care provider.  2019 Elsevier/Gold Standard (2015-07-07 18:16:03)

## 2018-03-18 NOTE — Progress Notes (Signed)
Russell Ramos is a 43 y.o. male who [facility] today for follow-up anxiety depression and hypertension.  Anxiety:  Russell Nipugene has a history of anxiety previously well controlled with counseling and intermittent low-dose Klonopin.  For a while he was off of medication entirely.  He had [place] [facility] [place] [facility] [place].  He notes that starting this fall around November and into December his anxiety started worsening again.  He notes he visited his family which is a source of stress.  Since then he has had worsening anxiety.  He has restarted his Klonopin and has actually increased the dose to 1 mg up to 3 times daily.  He notes this is not sufficient to control his panic attacks and anxiety.  In the past he was prescribed by his psychiatrist propranolol but never took it.  He describes panic attacks happening up to daily that are very disabling and obnoxious.  He notes that he takes the medications listed below for his diabetes.  He denies polyuria or polydipsia.  He notes that he is not checking his blood sugar regularly nor eating a low carbohydrate diet all the time.  Additionally he takes medications listed below with the exception of propranolol for hypertension.  No chest pain palpitations shortness of breath aside from panic attacks.  Sleep apnea: Patient notes he is no longer using CPAP.  He thinks he sleeps okay but is not sure.  ROS as above:  Exam:  BP 136/77   Pulse 66   Ht 5' 8.5" (1.74 m)   Wt (!) 306 lb (138.8 kg)   BMI 45.85 kg/m  Wt Readings from Last 5 Encounters:  03/18/18 (!) 306 lb (138.8 kg)  10/07/17 (!) 305 lb (138.3 kg)  10/01/17 300 lb (136.1 kg)  09/02/17 (!) 306 lb (138.8 kg)  08/21/17 (!) 306 lb (138.8 kg)    Gen: Well NAD HEENT: EOMI,  MMM Lungs: Normal work of breathing.  CTABL Heart: RRR no MRG Abd: NABS, Soft. Nondistended, Nontender Exts: Brisk capillary refill, warm and well perfused.  Psych alert and oriented normal speech thought process and affect.  No SI or HI expressed.  [facility] 03/18/2018 08/29/2016 05/28/2016 03/19/2016 01/16/2016  Decreased Interest 1 0 0 1 0  Down, Depressed, Hopeless 1 0 1 0 0  PHQ - 2 Score 2 0 1 1 0  Altered sleeping 3 0 1 1 3   Tired, decreased energy 0 0 2 3 3   Change in appetite 0 0 1 1 1   Feeling bad or failure about yourself  1 0 1 1 1   Trouble concentrating 1 0 1 0 2  Moving slowly or fidgety/restless 0 0 0 0 0  Suicidal thoughts 0 0 0 0 0  PHQ-9 Score 7 0 7 7 10   Difficult doing work/chores Not difficult at all - - - Somewhat difficult  Some recent data might be hidden    GAD 7 : Generalized Anxiety Score 03/18/2018 08/29/2016 05/28/2016 03/19/2016  Nervous, Anxious, on Edge 1 1 2 1   Control/stop worrying 1 0 1 1  Worry too much - different things 2 0 1 1  Trouble relaxing 1 0 1 1  Restless 0 0 1 1  Easily annoyed or irritable 3 0 1 1  Afraid - awful might happen 3 1 1 1   Total GAD 7 Score 11 2 8 7   Anxiety  Difficulty Very difficult Not difficult at all Somewhat difficult Extremely difficult      Lab and Radiology Results Results for orders placed or performed in visit on 03/18/18 (from the past 72 hour(s))  POCT HgB A1C     Status: Abnormal   Collection Time: 03/18/18  8:18 AM  Result Value Ref Range   Hemoglobin A1C 7.5 (A) 4.0 - 5.6 %   HbA1c POC (<> result, manual entry)     HbA1c, POC (prediabetic range)     HbA1c, POC (controlled diabetic range)     No results found.    Assessment and Plan: 43 y.o. male with  Anxiety: Not well controlled.  We discussed options.   Patient already has an appoint scheduled with his mood treatment center in about a month.  In the meantime will start further treatment.  Given failure or inadequate response with Zoloft previously will switch to SNRI  class.  Will start Cymbalta. Additionally will continue moderate dose Klonopin.  However plan to reduce dose a bit down to 1 mg twice daily.  Hopefully will be able to wean off or wean down that medication in the near future. Again proceed with counseling and self-guided cognitive behavioral therapy.  Lastly given his panic response he may benefit from nonselective beta-blocker as needed for breakthrough symptoms.  We will represcribed propranolol.  Recheck 2 weeks.  Diabetes: Not ideally controlled but not terribly controlled either.  Plan to work on diet and continue current regimen and recheck as above.  Hypertension: Blood pressure reasonably controlled continue current regimen.  Check basic fasting labs to follow-up hypertension diabetes hyperlipidemia and check TSH for his panic attacks.  Additional work on diet control for diabetes and weight.  I spent 40 minutes with this patient, greater than 50% was face-to-face time counseling regarding ddx and plan.   CC: [facility].  Phone: [phone] Fax: [phone] PDMP reviewed during this encounter. Orders Placed This Encounter  Procedures  . CBC  . COMPLETE METABOLIC PANEL WITH GFR  . TSH  . Lipid Panel w/reflex Direct LDL  . POCT HgB A1C   Meds ordered this encounter  Medications  . DULoxetine (CYMBALTA) 30 MG capsule    Sig: Take 1 capsule (30 mg total) by mouth daily.    Dispense:  30 capsule    Refill:  1  . clonazePAM (KLONOPIN) 1 MG tablet    Sig: Take 0.5 tablets (0.5 mg total) by mouth 2 (two) times daily.    Dispense:  60 tablet    Refill:  1    This request is for a new prescription for a controlled substance as required by Federal/State law.  . propranolol (INDERAL) 20 MG tablet    Sig: Take 1 tablet (20 mg total) by mouth every 6 (six) hours as needed (anxiety/panic attack).    Dispense:  60 tablet    Refill:  1     Historical information moved to improve visibility  of documentation.  Past Medical History:  Diagnosis Date  . Anxiety   . Chronic left shoulder pain   . Diabetes mellitus without complication (HCC)   . HTN (hypertension) 02/20/2013  . Hypertension   . PONV (postoperative nausea and vomiting)   . PTSD (post-traumatic stress disorder)   . Pulmonary embolism (HCC)   . Pulmonary hypertension (HCC)   . Sleep apnea    does not use CPAP   Past Surgical History:  Procedure Laterality Date  .  KNEE SURGERY Left   . RADIOLOGY WITH ANESTHESIA Left 10/01/2017   Procedure: MRI OF LEFT SHOULDER WITH CONTRAST AND DG FLOURO GUIDE NEEDLE INJECTION;  Surgeon: Radiologist, Medication, MD;  Location: [facility] OR;  Service: Radiology;  Laterality: Left;   Social History   Tobacco Use  . Smoking status: Former Smoker    Packs/day: 1.00    Types: Cigarettes    Last attempt to quit: 09/12/2015    Years since quitting: 2.5  . Smokeless tobacco: Never Used  Substance Use Topics  . Alcohol use: Yes    Comment: socially   family history includes Diabetes in his mother.  Medications: Current Outpatient Medications  Medication Sig Dispense Refill  . AMBULATORY NON FORMULARY MEDICATION Continuous positive airway pressure (CPAP) machine auto titrate to a max pressure of 15 cm of H2O pressure, with all supplemental supplies as needed. AHI 13.3 1 each 0  . AMBULATORY NON FORMULARY MEDICATION Single glucometer with lancets, test strips. Use glucometer of choice Test daily.  Uncontrolled Diabetes E11.9 1 each 0  . atorvastatin (LIPITOR) 80 MG tablet TAKE 1 TABLET BY MOUTH EVERY DAY 90 tablet 1  . cholecalciferol (VITAMIN D) 1000 units tablet Take 1,000 Units by mouth daily.    . clonazePAM (KLONOPIN) 1 MG tablet Take 0.5 tablets (0.5 mg total) by mouth 2 (two) times daily. 60 tablet 1  . DULoxetine (CYMBALTA) 30 MG capsule Take 1 capsule (30 mg total) by mouth daily. 30 capsule 1  . JARDIANCE 25 MG TABS tablet TAKE 1 TABLET BY MOUTH EVERY DAY 90 tablet 3  .  lisinopril (PRINIVIL,ZESTRIL) 5 MG tablet TAKE 1 TABLET BY MOUTH EVERY DAY 90 tablet 1  . omeprazole (PRILOSEC) 40 MG capsule TAKE 1 CAPSULE (40 MG TOTAL) BY MOUTH DAILY. 90 capsule 3  . propranolol (INDERAL) 20 MG tablet Take 1 tablet (20 mg total) by mouth every 6 (six) hours as needed (anxiety/panic attack). 60 tablet 1  . SAFETY-LOK 3CC SYR 22GX1.5" 22G X 1-1/2" 3 ML MISC TO BE USED TO DRAW TESTOSTERONE FOR INJECTION 6 each 0  . TRULICITY 1.5 MG/0.5ML SOPN INJECT 1.5 MG INTO THE SKIN ONCE A WEEK. 6 pen 6   No current facility-administered medications for this visit.    No Known Allergies   Discussed warning signs or symptoms. Please see discharge instructions. Patient expresses understanding.

## 2018-03-19 LAB — COMPLETE METABOLIC PANEL WITH GFR
AG RATIO: 1.2 (calc) (ref 1.0–2.5)
ALT: 14 U/L (ref 9–46)
AST: 12 U/L (ref 10–40)
Albumin: 4.4 g/dL (ref 3.6–5.1)
Alkaline phosphatase (APISO): 72 U/L (ref 40–115)
BILIRUBIN TOTAL: 0.5 mg/dL (ref 0.2–1.2)
BUN: 22 mg/dL (ref 7–25)
CALCIUM: 10.2 mg/dL (ref 8.6–10.3)
CHLORIDE: 103 mmol/L (ref 98–110)
CO2: 26 mmol/L (ref 20–32)
Creat: 1.08 mg/dL (ref 0.60–1.35)
GFR, EST NON AFRICAN AMERICAN: 85 mL/min/{1.73_m2} (ref >=60)
GFR, Est African American: 99 mL/min/{1.73_m2} (ref >=60)
GLUCOSE: 158 mg/dL — AB (ref 65–99)
Globulin: 3.7 g/dL (calc) (ref 1.9–3.7)
Potassium: 4.8 mmol/L (ref 3.5–5.3)
Sodium: 138 mmol/L (ref 135–146)
TOTAL PROTEIN: 8.1 g/dL (ref 6.1–8.1)

## 2018-03-19 LAB — LIPID PANEL W/REFLEX DIRECT LDL
Cholesterol: 212 mg/dL — ABNORMAL HIGH (ref <200)
HDL: 32 mg/dL — AB (ref >40)
LDL CHOLESTEROL (CALC): 136 mg/dL — AB
Non-HDL Cholesterol (Calc): 180 mg/dL (calc) — ABNORMAL HIGH (ref <130)
TRIGLYCERIDES: 285 mg/dL — AB (ref <150)
Total CHOL/HDL Ratio: 6.6 (calc) — ABNORMAL HIGH (ref <5.0)

## 2018-03-19 LAB — CBC
HCT: 46.4 % (ref 38.5–50.0)
Hemoglobin: 15.4 g/dL (ref 13.2–17.1)
MCH: 30.1 pg (ref 27.0–33.0)
MCHC: 33.2 g/dL (ref 32.0–36.0)
MCV: 90.8 fL (ref 80.0–100.0)
MPV: 8.6 fL (ref 7.5–12.5)
Platelets: 451 10*3/uL — ABNORMAL HIGH (ref 140–400)
RBC: 5.11 10*6/uL (ref 4.20–5.80)
RDW: 12.1 % (ref 11.0–15.0)
WBC: 8.4 10*3/uL (ref 3.8–10.8)

## 2018-03-19 LAB — TSH: TSH: 2.21 mIU/L (ref 0.40–4.50)

## 2018-03-20 ENCOUNTER — Telehealth: Payer: Self-pay

## 2018-03-20 NOTE — Telephone Encounter (Signed)
Griffin's wife called and states he thought the Klonopin was increased to 1 tablet twice daily. The prescription reads half twice daily. Please advise.

## 2018-03-21 MED ORDER — CLONAZEPAM 1 MG PO TABS: 1.0000 mg | ORAL_TABLET | Freq: Two times a day (BID) | ORAL | 1 refills | Status: DC

## 2018-03-21 NOTE — Telephone Encounter (Signed)
Should take 1 mg twice daily.  Take full pill.

## 2018-03-21 NOTE — Telephone Encounter (Signed)
Pharmacy will not fill it for the 60 due to the directions on the prescription. It will have to been updated.

## 2018-03-21 NOTE — Telephone Encounter (Signed)
Patient has been advised. Rhonda Cunningham,CMA  

## 2018-03-21 NOTE — Telephone Encounter (Signed)
I clarified and resent the prescription.  It says on the prescription 1 mg twice daily dispense #60.  There should not be an issue with this prescription.  Please a me know if the pharmacy continues to have a problem.

## 2018-03-31 ENCOUNTER — Encounter: Payer: Self-pay | Admitting: Family Medicine

## 2018-03-31 ENCOUNTER — Ambulatory Visit: Payer: BLUE CROSS/BLUE SHIELD | Admitting: Family Medicine

## 2018-03-31 VITALS — BP 132/74 | HR 69 | Ht 68.5 in | Wt 302.0 lb

## 2018-03-31 DIAGNOSIS — F410 Panic disorder [episodic paroxysmal anxiety] without agoraphobia: Secondary | ICD-10-CM | POA: Diagnosis not present

## 2018-03-31 MED ORDER — DULOXETINE HCL 60 MG PO CPEP: 60.0000 mg | ORAL_CAPSULE | Freq: Every day | ORAL | 1 refills | Status: DC

## 2018-03-31 MED ORDER — CLONAZEPAM 1 MG PO TABS: 1.0000 mg | ORAL_TABLET | Freq: Three times a day (TID) | ORAL | 1 refills | Status: DC

## 2018-03-31 MED ORDER — PROPRANOLOL HCL ER 80 MG PO CP24: 80.0000 mg | ORAL_CAPSULE | Freq: Every day | ORAL | 1 refills | Status: DC

## 2018-03-31 NOTE — Patient Instructions (Addendum)
Thank you for coming in today. Follow up with psychiatry.  [facility] [address]  Increase klonopin to 3x daily.  Switch to extended release Propranolol daily.  Increase cymbalta to 60mg  daily.   Recheck in 2 weeks.   Continue work up with Cardiology.   Keep track of blood pressure and hear rate.   Let me know if worsening.    Panic Attack A panic attack is a sudden episode of severe anxiety, fear, or discomfort that causes physical and emotional symptoms. The attack may be in response to something frightening, or it may occur for no known reason. Symptoms of a panic attack can be similar to symptoms of a heart attack or stroke. It is important to see your health care provider when you have a panic attack so that these conditions can be ruled out. A panic attack is a symptom of another condition. Most panic attacks go away with treatment of the underlying problem. If you have panic attacks often, you may have a condition called panic disorder. What are the causes? A panic attack may be caused by:  An extreme, life-threatening situation, such as a war or natural disaster.  An anxiety disorder, such as post-traumatic stress disorder.  Depression.  Certain medical conditions, including heart problems, neurological conditions, and infections.  Certain over-the-counter and prescription medicines.  Illegal drugs that increase heart rate and blood pressure, such as methamphetamine.  Alcohol.  Supplements that increase anxiety.  Panic disorder. What increases the risk? You are more likely to develop this condition if:  You have an anxiety disorder.  You have another mental health condition.  You take certain medicines.  You use alcohol, illegal drugs, or other substances.  You are under extreme stress.  A life event is causing increased feelings of anxiety and depression. What are the signs or symptoms? A panic attack starts suddenly, usually  lasts about 20 minutes, and occurs with one or more of the following:  A pounding heart.  A feeling that your heart is beating irregularly or faster than normal (palpitations).  Sweating.  Trembling or shaking.  Shortness of breath or feeling smothered.  Feeling choked.  Chest pain or discomfort.  Nausea or a strange feeling in your stomach.  Dizziness, feeling lightheaded, or feeling like you might faint.  Chills or hot flashes.  Numbness or tingling in your lips, hands, or feet.  Feeling confused, or feeling that you are not yourself.  Fear of losing control or being emotionally unstable.  Fear of dying. How is this diagnosed? A panic attack is diagnosed with an assessment by your health care provider. During the assessment your health care provider will ask questions about:  Your history of anxiety, depression, and panic attacks.  Your medical history.  Whether you drink alcohol, use illegal drugs, take supplements, or take medicines. Be honest about your substance use. Your health care provider may also:  Order blood tests or other kinds of tests to rule out serious medical conditions.  Refer you to a mental health professional for further evaluation. How is this treated? Treatment depends on the cause of the panic attack:  If the cause is a medical problem, your health care provider will either treat that problem or refer you to a specialist.  If the cause is emotional, you may be given anti-anxiety medicines or referred to a counselor. These medicines may reduce how often attacks happen, reduce how severe the attacks are, and lower anxiety.  If the  cause is a medicine, your health care provider may tell you to stop the medicine, change your dose, or take a different medicine.  If the cause is a drug, treatment may involve letting the drug wear off and taking medicine to help the drug leave your body or to counteract its effects. Attacks caused by drug abuse  may continue even if you stop using the drug. Follow these instructions at home:  Take over-the-counter and prescription medicines only as told by your health care provider.  If you feel anxious, limit your caffeine intake.  Take good care of your physical and mental health by: ? Eating a balanced diet that includes plenty of fresh fruits and vegetables, whole grains, lean meats, and low-fat dairy. ? Getting plenty of rest. Try to get 7-8 hours of uninterrupted sleep each night. ? Exercising regularly. Try to get 30 minutes of physical activity at least 5 days a week. ? Not smoking. Talk to your health care provider if you need help quitting. ? Limiting alcohol intake to no more than 1 drink a day for nonpregnant women and 2 drinks a day for men. One drink equals 12 oz of beer, 5 oz of wine, or 1 oz of hard liquor.  Keep all follow-up visits as told by your health care provider. This is important. Panic attacks may have underlying physical or emotional problems that take time to accurately diagnose. Contact a health care provider if:  Your symptoms do not improve, or they get worse.  You are not able to take your medicine as prescribed because of side effects. Get help right away if:  You have serious thoughts about hurting yourself or others.  You have symptoms of a panic attack. Do not drive yourself to the hospital. Have someone else drive you or call an ambulance. If you ever feel like you may hurt yourself or others, or you have thoughts about taking your own life, get help right away. You can go to your nearest emergency department or call:  Your local emergency services (911 in the [place]  A suicide crisis helpline, such as the National Suicide Prevention Lifeline at (936)594-07501-631-857-8636. This is open 24 hours a day. Summary  A panic attack is a sign of a serious health or mental health condition. Get help right away. Do not drive yourself to the hospital. Have someone else drive  you or call an ambulance.  Always see a health care provider to have the reasons for the panic attack correctly diagnosed.  If your panic attack was caused by a physical problem, follow your health care provider's suggestions for medicine, referral to a specialist, and lifestyle changes.  If your panic attack was caused by an emotional problem, follow through with counseling from a qualified mental health specialist.  If you feel like you may hurt yourself or others, call 911 and get help right away. This information is not intended to replace advice given to you by your health care provider. Make sure you discuss any questions you have with your health care provider. Document Released: 02/05/2005 Document Revised: 03/16/2016 Document Reviewed: 03/16/2016 [employer]vier Interactive Patient Education  2019 [employer]

## 2018-03-31 NOTE — Progress Notes (Addendum)
Russell Ramos is a 43 y.o. male, with a history of anxiety and pulmonary embolism, who [facility] today for follow-up on anxiety and depression. Russell Ramos has a history of anxiety that was well-controlled with counseling and intermittent low-dose Klonopin, up until the fall of 2019, when his symptoms began worsening.  At his last visit on 01/28, Russell Ramos was started on Cymbalta 30 mg, decreased Klonopin to 1 mg twice daily, and started propranolol 20 mg. He went to the ED on 01/31 for a panic attack and chest pain. Since then, he was seen by cardiology (Dr. Boneta Luckshinehart). He's had a negative work-up so far and normal results on an echo. He is waiting to schedule a stress test when his work schedule will allow. Due to his history of PE, Russell Ramos is concerned that he has an underlying problem with his heart that is causing the symptoms of chest pain.  He does not feel that the medication changes made at his last appointment have made a difference. He thinks the propranolol decreases the severity of his panic attack symptoms to some extent, but not much. On a daily basis, he feels fine up until he has a panic attack. He notes having a lot of triggers for panic attacks, such as something on TV or eating a certain food, but he can never predict what is going to be the specific trigger for an attack. Russell Ramos notes feeling very tired of his current situation, and he really desires to get to the bottom of what is going on. He would really like to be certain that there are no underlying causes of his chest pain and panic attacks. No SI/HI.   ROS as above:  Exam:  BP 132/74   Pulse 69   Ht 5' 8.5" (1.74 m)   Wt (!) 302 lb (137 kg)   BMI 45.25 kg/m  Wt Readings from Last 5 Encounters:  03/31/18 (!) 302 lb (137 kg)  03/18/18 (!) 306 lb (138.8 kg)  10/07/17 (!) 305  lb (138.3 kg)  10/01/17 300 lb (136.1 kg)  09/02/17 (!) 306 lb (138.8 kg)    Gen: Well NAD HEENT: EOMI,  MMM Lungs: Normal work of breathing. CTABL Heart: RRR no MRG Abd: NABS, Soft. Nondistended, Nontender Exts: Brisk capillary refill, warm and well perfused. Psych: Alert and oriented. Normal speech, thought process, and affect. No SI or HI expressed.  [facility] 03/31/2018 03/18/2018 08/29/2016  Decreased Interest 1 1 0  Down, Depressed, Hopeless 1 1 0  PHQ - 2 Score 2 2 0  Altered sleeping 3 3 0  Tired, decreased energy 0 0 0  Change in appetite 0 0 0  Feeling bad or failure about yourself  1 1 0  Trouble concentrating 2 1 0  Moving slowly or fidgety/restless - 0 0  Suicidal thoughts 0 0 0  PHQ-9 Score 8 7 0  Difficult doing work/chores Somewhat difficult Not difficult at all -  Some recent data might be hidden   GAD 7 : Generalized Anxiety Score 03/31/2018 03/18/2018 08/29/2016 05/28/2016  Nervous, Anxious, on Edge 3 1 1 2   Control/stop worrying 3 1 0 1  Worry too much - different things 3 2 0 1  Trouble relaxing 3 1 0 1  Restless 0 0 0 1  Easily annoyed or irritable 3 3 0 1  Afraid - awful might happen 3 3 1  1  Total GAD 7 Score 18 11 2 8   Anxiety Difficulty Somewhat difficult Very difficult Not difficult at all Somewhat difficult    Received echocardiogram report dated 03/28/2018 Left ventricular ejection fraction 65 to 70%.  Normal wall thickness.  Normal motion.  No thrombus.  Unable to adequately determine diastolic dysfunction. Result will be sent to scan.   Assessment and Plan: 43 y.o. male with  Anxiety/Depression: Mr. Szot anxiety has been worsening over the past two weeks, and he has had several panic attacks. PHQ9 score is 18 today. Currently undergoing work-up by cardiology for underlying cause of his chest pain and panic attacks. Work-up, including echo, has been normal. He is in the process of scheduling a stress test.  He is feeling very  tired of the constant panic attacks and wants to figure out if there is an underlying cause. He expressed that he can now empathize with why people would commit suicide. However, he denies any thoughts of harming himself or desire to commit suicide. No concern for SI/HI at this time.  Adjusted his medications today, due to worsening symptoms. Expressed mild benefit with propranolol only for a short time after taking the medication, so switched propranolol to extended release and increased dose to 80 mg. Increased Klonopin to 1 mg TID and Cymbalta to 60 mg daily. Follow-up in about two weeks or sooner if worsening. [facility] end of February.  PDMP reviewed during this encounter. No orders of the defined types were placed in this encounter.  Meds ordered this encounter  Medications  . propranolol ER (INDERAL LA) 80 MG 24 hr capsule    Sig: Take 1 capsule (80 mg total) by mouth daily.    Dispense:  30 capsule    Refill:  1  . clonazePAM (KLONOPIN) 1 MG tablet    Sig: Take 1 tablet (1 mg total) by mouth 3 (three) times daily.    Dispense:  90 tablet    Refill:  1    This request is for a new prescription for a controlled substance as required by Federal/State law.  . DULoxetine (CYMBALTA) 60 MG capsule    Sig: Take 1 capsule (60 mg total) by mouth daily.    Dispense:  30 capsule    Refill:  1     Historical information moved to improve visibility of documentation.  Past Medical History:  Diagnosis Date  . Anxiety   . Chronic left shoulder pain   . Diabetes mellitus without complication (HCC)   . HTN (hypertension) 02/20/2013  . Hypertension   . PONV (postoperative nausea and vomiting)   . PTSD (post-traumatic stress disorder)   . Pulmonary embolism (HCC)   . Pulmonary hypertension (HCC)   . Sleep apnea    does not use CPAP   Past Surgical History:  Procedure Laterality Date  . KNEE SURGERY Left   . RADIOLOGY WITH ANESTHESIA Left 10/01/2017     Procedure: MRI OF LEFT SHOULDER WITH CONTRAST AND DG FLOURO GUIDE NEEDLE INJECTION;  Surgeon: Radiologist, Medication, MD;  Location: [facility] OR;  Service: Radiology;  Laterality: Left;   Social History   Tobacco Use  . Smoking status: Former Smoker    Packs/day: 1.00    Types: Cigarettes    Last attempt to quit: 09/12/2015    Years since quitting: 2.5  . Smokeless tobacco: Never Used  Substance Use Topics  . Alcohol use: Yes    Comment: socially   family  history includes Diabetes in his mother.  Medications: Current Outpatient Medications  Medication Sig Dispense Refill  . AMBULATORY NON FORMULARY MEDICATION Continuous positive airway pressure (CPAP) machine auto titrate to a max pressure of 15 cm of H2O pressure, with all supplemental supplies as needed. AHI 13.3 1 each 0  . AMBULATORY NON FORMULARY MEDICATION Single glucometer with lancets, test strips. Use glucometer of choice Test daily.  Uncontrolled Diabetes E11.9 1 each 0  . atorvastatin (LIPITOR) 80 MG tablet TAKE 1 TABLET BY MOUTH EVERY DAY 90 tablet 1  . cholecalciferol (VITAMIN D) 1000 units tablet Take 1,000 Units by mouth daily.    . clonazePAM (KLONOPIN) 1 MG tablet Take 1 tablet (1 mg total) by mouth 3 (three) times daily. 90 tablet 1  . DULoxetine (CYMBALTA) 60 MG capsule Take 1 capsule (60 mg total) by mouth daily. 30 capsule 1  . JARDIANCE 25 MG TABS tablet TAKE 1 TABLET BY MOUTH EVERY DAY 90 tablet 3  . lisinopril (PRINIVIL,ZESTRIL) 5 MG tablet TAKE 1 TABLET BY MOUTH EVERY DAY 90 tablet 1  . omeprazole (PRILOSEC) 40 MG capsule TAKE 1 CAPSULE (40 MG TOTAL) BY MOUTH DAILY. 90 capsule 3  . SAFETY-LOK 3CC SYR 22GX1.5" 22G X 1-1/2" 3 ML MISC TO BE USED TO DRAW 1ML TESTOSTERONE FOR INJECTION 6 each 0  . TRULICITY 1.5 MG/0.5ML SOPN INJECT 1.5 MG INTO THE SKIN ONCE A WEEK. 6 pen 6  . propranolol ER (INDERAL LA) 80 MG 24 hr capsule Take 1 capsule (80 mg total) by mouth daily. 30 capsule 1   No current  facility-administered medications for this visit.    No Known Allergies   Discussed warning signs or symptoms. Please see discharge instructions. Patient expresses understanding.  I personally was present and performed or re-performed the history, physical exam and medical decision-making activities of this service and have verified that the service and findings are accurately documented in the student's note. ___________________________________________ Clementeen GrahamEvan Iban Utz M.D., ABFM., CAQSM. Primary Care and Sports Medicine Adjunct Instructor of Family Medicine  [employer]

## 2018-04-04 ENCOUNTER — Other Ambulatory Visit: Payer: Self-pay | Admitting: Family Medicine

## 2018-04-04 DIAGNOSIS — R748 Abnormal levels of other serum enzymes: Secondary | ICD-10-CM

## 2018-04-04 DIAGNOSIS — I10 Essential (primary) hypertension: Secondary | ICD-10-CM

## 2018-04-04 DIAGNOSIS — R5382 Chronic fatigue, unspecified: Secondary | ICD-10-CM

## 2018-04-04 DIAGNOSIS — E1169 Type 2 diabetes mellitus with other specified complication: Secondary | ICD-10-CM

## 2018-04-04 DIAGNOSIS — E1165 Type 2 diabetes mellitus with hyperglycemia: Secondary | ICD-10-CM

## 2018-04-04 DIAGNOSIS — E782 Mixed hyperlipidemia: Secondary | ICD-10-CM

## 2018-04-14 ENCOUNTER — Other Ambulatory Visit: Payer: Self-pay | Admitting: Family Medicine

## 2018-04-16 ENCOUNTER — Other Ambulatory Visit: Payer: Self-pay | Admitting: Family Medicine

## 2018-04-21 ENCOUNTER — Encounter: Payer: Self-pay | Admitting: Family Medicine

## 2018-04-21 ENCOUNTER — Ambulatory Visit (INDEPENDENT_AMBULATORY_CARE_PROVIDER_SITE_OTHER): Payer: BLUE CROSS/BLUE SHIELD | Admitting: Family Medicine

## 2018-04-21 VITALS — BP 119/80 | HR 65 | Wt 306.0 lb

## 2018-04-21 DIAGNOSIS — F5232 Male orgasmic disorder: Secondary | ICD-10-CM | POA: Diagnosis not present

## 2018-04-21 DIAGNOSIS — F410 Panic disorder [episodic paroxysmal anxiety] without agoraphobia: Secondary | ICD-10-CM

## 2018-04-21 NOTE — Progress Notes (Signed)
Russell Ramos is a 43 y.o. male who presents to [name] today for follow-up anxiety.   Russell Ramos was seen several times most recently about 2 weeks ago for panic attack.  His Cymbalta was increased as well as his Klonopin.  Additionally he had been referred to psychiatry.  He had a bad panic attack on February 11 requiring a trip to the emergency department.  However fortunately interim he was seen by psychiatry and GeneSight test was done and he was switched to Pristiq and Klonopin was increased.  He feels a lot better but is still having some panic symptoms.  S[facility] Psychiat[facility] Dr [facility] in [place].   Patient does note sexual side effects with Cymbalta in the past and Zoloft in the past.  He notes difficulty maintaining an erection and delayed orgasm.  He is not sure if the symptoms have continued on Pristiq.  He is only been taking it for a few days.  ROS as above:  Exam:  BP 119/80   Pulse 65   Wt (!) 306 lb (138.8 kg)   BMI 45.85 kg/m  Wt Readings from Last 5 Encounters:  04/21/18 (!) 306 lb (138.8 kg)  03/31/18 (!) 302 lb (137 kg)  03/18/18 (!) 306 lb (138.8 kg)  10/07/17 (!) 305 lb (138.3 kg)  10/01/17 300 lb (136.1 kg)    Gen: Well NAD HEENT: EOMI,  MMM Lungs: Normal work of breathing. CTABL Heart: RRR no MRG Abd: NABS, Soft. Nondistended, Nontender Exts: Brisk capillary refill, warm and well perfused.  Psych: Alert and oriented normal speech thought process and affect.  No SI or HI expressed.  Depression screen [facility] 2/9 04/21/2018 03/31/2018 03/18/2018 08/29/2016 05/28/2016  Decreased Interest 0 1 1 0 0  Down, Depressed, Hopeless 0 1 1 0 1  PHQ - 2 Score 0 2 2 0 1  Altered sleeping 3 3 3  0 1  Tired, decreased energy 0 0 0 0 2  Change in appetite 0 0 0 0 1  Feeling bad or failure about yourself  0 1 1 0 1    Trouble concentrating 1 2 1  0 1  Moving slowly or fidgety/restless 0 - 0 0 0  Suicidal thoughts 0 0 0 0 0  PHQ-9 Score 4 8 7  0 7  Difficult doing work/chores Somewhat difficult Somewhat difficult Not difficult at all - -  Some recent data might be hidden   GAD 7 : Generalized Anxiety Score 04/21/2018 03/31/2018 03/18/2018 08/29/2016  Nervous, Anxious, on Edge 1 3 1 1   Control/stop worrying 1 3 1  0  Worry too much - different things 0 3 2 0  Trouble relaxing 1 3 1  0  Restless 0 0 0 0  Easily annoyed or irritable 1 3 3  0  Afraid - awful might happen 1 3 3 1   Total GAD 7 Score 5 18 11 2   Anxiety Difficulty Somewhat difficult Somewhat difficult Very difficult Not difficult at all      Lab and Radiology Results No results found for this or any previous visit (from the past 72 hour(s)). No results found.    Assessment and Plan: 43 y.o. male with panic disorder: Management with psychiatry.  Significant improved symptoms.  Plan to continue management with psychiatry.  Recheck with me if all is well in about 2 months.  Return sooner if needed.  Sexual side effects.  Delayed orgasm  is likely due to Zoloft in the past.  Additionally Cymbalta can have sexual side effects as well.  We will see how he does on Pristiq.  Certainly reasonable to consider Viagra or Cialis if needed.  PDMP not reviewed this encounter. No orders of the defined types were placed in this encounter.  No orders of the defined types were placed in this encounter.    Historical information moved to improve visibility of documentation.  Past Medical History:  Diagnosis Date  . Anxiety   . Chronic left shoulder pain   . Diabetes mellitus without complication (HCC)   . HTN (hypertension) 02/20/2013  . Hypertension   . PONV (postoperative nausea and vomiting)   . PTSD (post-traumatic stress disorder)   . Pulmonary embolism (HCC)   . Pulmonary hypertension (HCC)   . Sleep apnea    does not use CPAP   Past Surgical  History:  Procedure Laterality Date  . KNEE SURGERY Left   . RADIOLOGY WITH ANESTHESIA Left 10/01/2017   Procedure: MRI OF LEFT SHOULDER WITH CONTRAST AND DG FLOURO GUIDE NEEDLE INJECTION;  Surgeon: Radiologist, Medication, MD;  Location: [facility] OR;  Service: Radiology;  Laterality: Left;   Social History   Tobacco Use  . Smoking status: Former Smoker    Packs/day: 1.00    Types: Cigarettes    Last attempt to quit: 09/12/2015    Years since quitting: 2.6  . Smokeless tobacco: Never Used  Substance Use Topics  . Alcohol use: Yes    Comment: socially   family history includes Diabetes in his mother.  Medications: Current Outpatient Medications  Medication Sig Dispense Refill  . AMBULATORY NON FORMULARY MEDICATION Continuous positive airway pressure (CPAP) machine auto titrate to a max pressure of 15 cm of H2O pressure, with all supplemental supplies as needed. AHI 13.3 1 each 0  . AMBULATORY NON FORMULARY MEDICATION Single glucometer with lancets, test strips. Use glucometer of choice Test daily.  Uncontrolled Diabetes E11.9 1 each 0  . atorvastatin (LIPITOR) 80 MG tablet TAKE 1 TABLET BY MOUTH EVERY DAY 90 tablet 1  . cholecalciferol (VITAMIN D) 1000 units tablet Take 1,000 Units by mouth daily.    . clonazePAM (KLONOPIN) 1 MG tablet Take 1 tablet (1 mg total) by mouth 3 (three) times daily. 90 tablet 1  . DULoxetine (CYMBALTA) 60 MG capsule Take 1 capsule (60 mg total) by mouth daily. 30 capsule 1  . glucose blood test strip USE TO TEST BLOOD SUGAR ONCE DAILY 100 each 0  . JARDIANCE 25 MG TABS tablet TAKE 1 TABLET BY MOUTH EVERY DAY 90 tablet 3  . Lancets (ONETOUCH DELICA PLUS LANCET33G) MISC USE TO TEST BLOOD SUGAR ONCE DAILY 100 each 0  . lisinopril (PRINIVIL,ZESTRIL) 5 MG tablet TAKE 1 TABLET BY MOUTH EVERY DAY 90 tablet 1  . omeprazole (PRILOSEC) 40 MG capsule TAKE 1 CAPSULE (40 MG TOTAL) BY MOUTH DAILY. 90 capsule 3  . propranolol ER (INDERAL LA) 80 MG 24 hr capsule Take 1  capsule (80 mg total) by mouth daily. 30 capsule 1  . SAFETY-LOK 3CC SYR 22GX1.5" 22G X 1-1/2" 3 ML MISC TO BE USED TO DRAW TESTOSTERONE FOR INJECTION 6 each 0  . TRULICITY 1.5 MG/0.5ML SOPN INJECT 1.5 MG INTO THE SKIN ONCE A WEEK. 6 pen 6   No current facility-administered medications for this visit.    No Known Allergies   Discussed warning signs or symptoms. Please see discharge instructions. Patient expresses understanding.

## 2018-04-21 NOTE — Patient Instructions (Addendum)
Thank you for coming in today. Recheck in early May.  Keep working on the mental health.  I am really happy that you got into psychiatry.  Let me know if you want to consider viagra or cialias.   Have your psychiatrist send notes.   Try to get the genesight or similar test to me.

## 2018-04-23 ENCOUNTER — Other Ambulatory Visit: Payer: Self-pay | Admitting: Family Medicine

## 2018-05-20 ENCOUNTER — Other Ambulatory Visit: Payer: Self-pay | Admitting: Family Medicine

## 2018-06-03 ENCOUNTER — Other Ambulatory Visit: Payer: Self-pay | Admitting: Family Medicine

## 2018-06-15 ENCOUNTER — Encounter: Payer: Self-pay | Admitting: Family Medicine

## 2018-06-16 ENCOUNTER — Ambulatory Visit (INDEPENDENT_AMBULATORY_CARE_PROVIDER_SITE_OTHER): Payer: BLUE CROSS/BLUE SHIELD

## 2018-06-16 ENCOUNTER — Other Ambulatory Visit: Payer: Self-pay

## 2018-06-16 ENCOUNTER — Ambulatory Visit (INDEPENDENT_AMBULATORY_CARE_PROVIDER_SITE_OTHER): Payer: BLUE CROSS/BLUE SHIELD | Admitting: Family Medicine

## 2018-06-16 ENCOUNTER — Encounter: Payer: Self-pay | Admitting: Family Medicine

## 2018-06-16 VITALS — BP 119/82 | HR 68 | Temp 98.6°F | Wt 306.0 lb

## 2018-06-16 DIAGNOSIS — E1165 Type 2 diabetes mellitus with hyperglycemia: Secondary | ICD-10-CM

## 2018-06-16 DIAGNOSIS — E782 Mixed hyperlipidemia: Secondary | ICD-10-CM | POA: Diagnosis not present

## 2018-06-16 DIAGNOSIS — R079 Chest pain, unspecified: Secondary | ICD-10-CM | POA: Diagnosis not present

## 2018-06-16 DIAGNOSIS — R1084 Generalized abdominal pain: Secondary | ICD-10-CM

## 2018-06-16 NOTE — Patient Instructions (Addendum)
Thank you for coming in today.  Get labs and ultrasound now.  I will get results to you ASAP.  Assuming labs and [place] come back normal next step is other lab and radiology tests.   We will continue to follow.    Cholelithiasis  Cholelithiasis is a form of gallbladder disease in which gallstones form in the gallbladder. The gallbladder is an organ that stores bile. Bile is made in the liver, and it helps to digest fats. Gallstones begin as small crystals and slowly grow into stones. They may cause no symptoms until the gallbladder tightens (contracts) and a gallstone is blocking the duct (gallbladder attack), which can cause pain. Cholelithiasis is also referred to as gallstones. There are two main types of gallstones:  Cholesterol stones. These are made of hardened cholesterol and are usually yellow-green in color. They are the most common type of gallstone. Cholesterol is a white, waxy, fat-like substance that is made in the liver.  Pigment stones. These are dark in color and are made of a red-yellow substance that forms when hemoglobin from red blood cells breaks down (bilirubin). What are the causes? This condition may be caused by an imbalance in the substances that bile is made of. This can happen if the bile:  Has too much bilirubin.  Has too much cholesterol.  Does not have enough bile salts. These salts help the body absorb and digest fats. In some cases, this condition can also be caused by the gallbladder not emptying completely or often enough. What increases the risk? The following factors may make you more likely to develop this condition:  Being male.  Having multiple pregnancies. Health care providers sometimes advise removing diseased gallbladders before future pregnancies.  Eating a diet that is heavy in fried foods, fat, and refined carbohydrates, like white bread and white rice.  Being obese.  Being older than age 110.  Prolonged use of medicines that contain  male hormones (estrogen).  Having diabetes mellitus.  Rapidly losing weight.  Having a family history of gallstones.  Being of [place] or [place] descent.  Having an intestinal disease such as Crohn disease.  Having metabolic syndrome.  Having cirrhosis.  Having severe types of anemia such as sickle cell anemia. What are the signs or symptoms? In most cases, there are no symptoms. These are known as silent gallstones. If a gallstone blocks the bile ducts, it can cause a gallbladder attack. The main symptom of a gallbladder attack is sudden pain in the upper right abdomen. The pain usually comes at night or after eating a large meal. The pain can last for one or several hours and can spread to the right shoulder or chest. If the bile duct is blocked for more than a few hours, it can cause infection or inflammation of the gallbladder, liver, or pancreas, which may cause:  Nausea.  Vomiting.  Abdominal pain that lasts for 5 hours or more.  Fever or chills.  Yellowing of the skin or the whites of the eyes (jaundice).  Dark urine.  Light-colored stools. How is this diagnosed? This condition may be diagnosed based on:  A physical exam.  Your medical history.  An ultrasound of your gallbladder.  CT scan.  MRI.  Blood tests to check for signs of infection or inflammation.  A scan of your gallbladder and bile ducts (biliary system) using nonharmful radioactive material and special cameras that can see the radioactive material (cholescintigram). This test checks to see how your gallbladder contracts  and whether bile ducts are blocked.  Inserting a small tube with a camera on the end (endoscope) through your mouth to inspect bile ducts and check for blockages (endoscopic retrograde cholangiopancreatogram). How is this treated? Treatment for gallstones depends on the severity of the condition. Silent gallstones do not need treatment. If the gallstones cause a  gallbladder attack or other symptoms, treatment may be required. Options for treatment include:  Surgery to remove the gallbladder (cholecystectomy). This is the most common treatment.  Medicines to dissolve gallstones. These are most effective at treating small gallstones. You may need to take medicines for up to 6-12 months.  Shock wave treatment (extracorporeal biliary lithotripsy). In this treatment, an ultrasound machine sends shock waves to the gallbladder to break gallstones into smaller pieces. These pieces can then be passed into the intestines or be dissolved by medicine. This is rarely used.  Removing gallstones through endoscopic retrograde cholangiopancreatogram. A small basket can be attached to the endoscope and used to capture and remove gallstones. Follow these instructions at home:  Take over-the-counter and prescription medicines only as told by your health care provider.  Maintain a healthy weight and follow a healthy diet. This includes: ? Reducing fatty foods, such as fried food. ? Reducing refined carbohydrates, like white bread and white rice. ? Increasing fiber. Aim for foods like almonds, fruit, and beans.  Keep all follow-up visits as told by your health care provider. This is important. Contact a health care provider if:  You think you have had a gallbladder attack.  You have been diagnosed with silent gallstones and you develop abdominal pain or indigestion. Get help right away if:  You have pain from a gallbladder attack that lasts for more than 2 hours.  You have abdominal pain that lasts for more than 5 hours.  You have a fever or chills.  You have persistent nausea and vomiting.  You develop jaundice.  You have dark urine or light-colored stools. Summary  Cholelithiasis (also called gallstones) is a form of gallbladder disease in which gallstones form in the gallbladder.  This condition is caused by an imbalance in the substances that make up  bile. This can happen if the bile has too much cholesterol, too much bilirubin, or not enough bile salts.  You are more likely to develop this condition if you are male, pregnant, using medicines with estrogen, obese, older than age 41, or have a family history of gallstones. You may also develop gallstones if you have diabetes, an intestinal disease, cirrhosis, or metabolic syndrome.  Treatment for gallstones depends on the severity of the condition. Silent gallstones do not need treatment.  If gallstones cause a gallbladder attack or other symptoms, treatment may be needed. The most common treatment is surgery to remove the gallbladder. This information is not intended to replace advice given to you by your health care provider. Make sure you discuss any questions you have with your health care provider. Document Released: 02/01/2005 Document Revised: 08/06/2016 Document Reviewed: 10/23/2015 Elsevier Interactive Patient Education  2019 [employer]

## 2018-06-16 NOTE — Progress Notes (Signed)
Russell Ramos is a 43 y.o. male who [facility] today for chest pain and nausea.  Patient has had episodes of recurrent chest pain anxiety nausea and vomiting.  This is been ongoing off and on for months.  He has had work-up in the past including EKGs and troponins in the emergency room which have been normal.  He notes his chest pain is central and does not radiate.  He has a pertinent medical history for pulmonary embolism causing heart strain in the past.  Additionally his panic disorder.  He is currently being treated for anxiety which in the most part he notes is pretty well controlled.  He is worried that he may have gallstones.  His wife had similar episode with her gallstones and would like to rule that out further.  He notes the symptoms are quite bothersome and are very distressing.  He states that he cannot live this way.    ROS as above:  Exam:  BP 119/82   Pulse 68   Temp 98.6 F (37 C) (Oral)   Wt (!) 306 lb (138.8 kg)   BMI 45.85 kg/m  Wt Readings from Last 5 Encounters:  06/16/18 (!) 306 lb (138.8 kg)  04/21/18 (!) 306 lb (138.8 kg)  03/31/18 (!) 302 lb (137 kg)  03/18/18 (!) 306 lb (138.8 kg)  10/07/17 (!) 305 lb (138.3 kg)    Gen: Well NAD HEENT: EOMI,  MMM Lungs: Normal work of breathing. CTABL Heart: RRR no MRG Abd: NABS, Soft. Nondistended, mildly tender right upper quadrant with no rebound or guarding. Exts: Brisk capillary refill, warm and well perfused.   Lab and Radiology Results  Twelve-lead EKG: Normal sinus rhythm heart rate 61 bpm.  No ST segment elevation or depression.  Normal EKG.    Assessment and Plan: 43 y.o. male with chest pain with nausea after eating.  Certainly could be due to gallstones or other etiology.  Patient already has had quite a bit of heart work-up that has been largely normal. Plan for  abdomen ultrasound and lab work-up listed below.  We will also check A1c as patient is going to be due for that for diabetes tomorrow.  Also check lipid panel.  If labs and ultrasound are normal neck step would likely be HIDA scan and potential lab work-up for possible pheochromocytoma.  PDMP not reviewed this encounter. Orders Placed This Encounter  Procedures  . US Abdomen Complete    Standing Status:   Future    Standing Expiration Date:   08/16/2019    Order Specific Question:   Reason for Exam (SYMPTOM  OR DIAGNOSIS REQUIRED)    Answer:   eval abd pain. Suspect gallstones    Order Specific Question:   Preferred imaging location?    Answer[facility]   [facility]  . CBC  . COMPLETE METABOLIC PANEL WITH GFR  . Lipase  . Lipid Panel w/reflex Direct LDL  . Hemoglobin A1c  . EKG 12-Lead   No orders of the defined types were placed in this encounter.    Historical information moved to improve visibility of documentation.  Past Medical History:  Diagnosis Date  . Anxiety   . Chronic left shoulder pain   . Diabetes mellitus without complication (HCC)   . HTN (hypertension) 02/20/2013  . Hypertension   . PONV (postoperative nausea and vomiting)   . PTSD (post-traumatic stress disorder)   . Pulmonary embolism (  HCC)   . Pulmonary hypertension (HCC)   . Sleep apnea    does not use CPAP   Past Surgical History:  Procedure Laterality Date  . KNEE SURGERY Left   . RADIOLOGY WITH ANESTHESIA Left 10/01/2017   Procedure: MRI OF LEFT SHOULDER WITH CONTRAST AND DG FLOURO GUIDE NEEDLE INJECTION;  Surgeon: Radiologist, Medication, MD;  Location: [facility] OR;  Service: Radiology;  Laterality: Left;   Social History   Tobacco Use  . Smoking status: Former Smoker    Packs/day: 1.00    Types: Cigarettes    Last attempt to quit: 09/12/2015    Years since quitting: 2.7  . Smokeless tobacco: Never Used  Substance Use Topics  . Alcohol use: Yes    Comment: socially   family history includes  Diabetes in his mother.  Medications: Current Outpatient Medications  Medication Sig Dispense Refill  . AMBULATORY NON FORMULARY MEDICATION Continuous positive airway pressure (CPAP) machine auto titrate to a max pressure of 15 cm of H2O pressure, with all supplemental supplies as needed. AHI 13.3 1 each 0  . AMBULATORY NON FORMULARY MEDICATION Single glucometer with lancets, test strips. Use glucometer of choice Test daily.  Uncontrolled Diabetes E11.9 1 each 0  . atorvastatin (LIPITOR) 80 MG tablet TAKE 1 TABLET BY MOUTH EVERY DAY 90 tablet 1  . cholecalciferol (VITAMIN D) 1000 units tablet Take 1,000 Units by mouth daily.    . clonazePAM (KLONOPIN) 2 MG tablet Take 1 tablet by mouth 2 (two) times daily.    Marland Kitchen desvenlafaxine (PRISTIQ) 50 MG 24 hr tablet Take 1 tablet by mouth daily.    Marland Kitchen glucose blood test strip USE TO TEST BLOOD SUGAR ONCE DAILY 100 each 0  . JARDIANCE 25 MG TABS tablet TAKE 1 TABLET BY MOUTH EVERY DAY 90 tablet 3  . Lancets (ONETOUCH DELICA PLUS LANCET33G) MISC USE TO TEST BLOOD SUGAR ONCE DAILY 100 each 0  . lisinopril (PRINIVIL,ZESTRIL) 5 MG tablet TAKE 1 TABLET BY MOUTH EVERY DAY 90 tablet 1  . omeprazole (PRILOSEC) 40 MG capsule TAKE 1 CAPSULE (40 MG TOTAL) BY MOUTH DAILY. 90 capsule 3  . propranolol ER (INDERAL LA) 80 MG 24 hr capsule TAKE 1 CAPSULE BY MOUTH EVERY DAY 90 capsule 1  . SAFETY-LOK 3CC SYR 22GX1.5" 22G X 1-1/2" 3 ML MISC TO BE USED TO DRAW TESTOSTERONE FOR INJECTION 6 each 0   No current facility-administered medications for this visit.    Allergies  Allergen Reactions  . Lactose     Other reaction(s): GI Upset (intolerance)     Discussed warning signs or symptoms. Please see discharge instructions. Patient expresses understanding.

## 2018-06-16 NOTE — Telephone Encounter (Signed)
Patient scheduled.

## 2018-06-17 LAB — CBC
HCT: 46.8 % (ref 38.5–50.0)
Hemoglobin: 15.6 g/dL (ref 13.2–17.1)
MCH: 30.3 pg (ref 27.0–33.0)
MCHC: 33.3 g/dL (ref 32.0–36.0)
MCV: 90.9 fL (ref 80.0–100.0)
MPV: 8.4 fL (ref 7.5–12.5)
Platelets: 425 10*3/uL — ABNORMAL HIGH (ref 140–400)
RBC: 5.15 10*6/uL (ref 4.20–5.80)
RDW: 12.3 % (ref 11.0–15.0)
WBC: 9.9 10*3/uL (ref 3.8–10.8)

## 2018-06-17 LAB — COMPLETE METABOLIC PANEL WITH GFR
AG Ratio: 1.2 (calc) (ref 1.0–2.5)
ALT: 14 U/L (ref 9–46)
AST: 11 U/L (ref 10–40)
Albumin: 4.3 g/dL (ref 3.6–5.1)
Alkaline phosphatase (APISO): 65 U/L (ref 36–130)
BUN: 20 mg/dL (ref 7–25)
CO2: 29 mmol/L (ref 20–32)
Calcium: 9.8 mg/dL (ref 8.6–10.3)
Chloride: 101 mmol/L (ref 98–110)
Creat: 1.16 mg/dL (ref 0.60–1.35)
GFR, Est African American: 91 mL/min/{1.73_m2} (ref >=60)
GFR, Est Non African American: 78 mL/min/{1.73_m2} (ref >=60)
Globulin: 3.5 g/dL (calc) (ref 1.9–3.7)
Glucose, Bld: 161 mg/dL — ABNORMAL HIGH (ref 65–99)
Potassium: 5 mmol/L (ref 3.5–5.3)
Sodium: 138 mmol/L (ref 135–146)
Total Bilirubin: 0.4 mg/dL (ref 0.2–1.2)
Total Protein: 7.8 g/dL (ref 6.1–8.1)

## 2018-06-17 LAB — LIPID PANEL W/REFLEX DIRECT LDL
Cholesterol: 218 mg/dL — ABNORMAL HIGH (ref <200)
HDL: 34 mg/dL — ABNORMAL LOW (ref >=40)
LDL Cholesterol (Calc): 145 mg/dL (calc) — ABNORMAL HIGH
Non-HDL Cholesterol (Calc): 184 mg/dL (calc) — ABNORMAL HIGH (ref <130)
Total CHOL/HDL Ratio: 6.4 (calc) — ABNORMAL HIGH (ref <5.0)
Triglycerides: 239 mg/dL — ABNORMAL HIGH (ref <150)

## 2018-06-17 LAB — HEMOGLOBIN A1C
Hgb A1c MFr Bld: 7.5 % of total Hgb — ABNORMAL HIGH (ref <5.7)
Mean Plasma Glucose: 169 (calc)
eAG (mmol/L): 9.3 (calc)

## 2018-06-17 LAB — LIPASE: Lipase: 22 U/L (ref 7–60)

## 2018-06-17 NOTE — Addendum Note (Signed)
Addended by: Rodolph Bong on: 06/17/2018 06:49 AM   Modules accepted: Orders

## 2018-06-17 NOTE — Addendum Note (Signed)
Addended by: Mallie Snooks R on: 06/17/2018 03:19 PM   Modules accepted: Orders

## 2018-06-19 ENCOUNTER — Ambulatory Visit (HOSPITAL_COMMUNITY): Payer: BLUE CROSS/BLUE SHIELD

## 2018-06-20 ENCOUNTER — Other Ambulatory Visit: Payer: Self-pay

## 2018-06-20 ENCOUNTER — Encounter: Payer: Self-pay | Admitting: Family Medicine

## 2018-06-20 ENCOUNTER — Other Ambulatory Visit: Payer: Self-pay | Admitting: Family Medicine

## 2018-06-20 ENCOUNTER — Ambulatory Visit (INDEPENDENT_AMBULATORY_CARE_PROVIDER_SITE_OTHER): Payer: BLUE CROSS/BLUE SHIELD | Admitting: Family Medicine

## 2018-06-20 ENCOUNTER — Encounter (HOSPITAL_COMMUNITY)
Admission: RE | Admit: 2018-06-20 | Discharge: 2018-06-20 | Disposition: A | Discharge status: Home / Self Care | Payer: BLUE CROSS/BLUE SHIELD | Source: Ambulatory Visit | Attending: Family Medicine | Admitting: Family Medicine

## 2018-06-20 VITALS — Ht 68.5 in | Wt 300.0 lb

## 2018-06-20 DIAGNOSIS — R079 Chest pain, unspecified: Secondary | ICD-10-CM | POA: Insufficient documentation

## 2018-06-20 DIAGNOSIS — R1084 Generalized abdominal pain: Secondary | ICD-10-CM | POA: Insufficient documentation

## 2018-06-20 DIAGNOSIS — R232 Flushing: Secondary | ICD-10-CM | POA: Diagnosis not present

## 2018-06-20 MED ORDER — TECHNETIUM TC 99M MEBROFENIN IV KIT
5.3000 | PACK | Freq: Once | INTRAVENOUS | Status: AC | PRN
  Administered 2018-06-20: 07:00:00 5.3 via INTRAVENOUS

## 2018-06-20 NOTE — Patient Instructions (Signed)
Thank you for coming in today.  Please start the 24-hour urine collection for evaluation for possible pheochromocytoma.  Additionally contact your cardiologist and try to get rescheduled for stress test etc. I will send him a letter today as well.  Once the urine test is back I will prescribe a medicine called Coreg which can block the adrenaline system a bit.  We will see if that helps any.  If heart evaluation is unremarkable and also you are still symptomatic and we do not know what is wrong we may also investigate with gastroenterology.    Pheochromocytoma A pheochromocytoma is a rare tumor that forms in cells that produce and release hormones (neuroendocrine cells). These tumors are found in the center of the adrenal glands, which sit on top of the kidneys. A pheochromocytoma creates excess hormones that can lead to symptoms such as high blood pressure and a fast heart rate. Most pheochromocytomas are not cancerous (are benign). Some are cancerous (are malignant) and can spread to other parts of the body (metastasize). What are the causes? The cause of this condition is not known. What increases the risk? The following factors may make you more likely to develop this condition:  Being 66-46 years of age.  Being male.  Having certain inherited (genetic) syndromes, such as multiple endocrine neoplasia type 1 or 2.  Having a weakened immune system due to: ? Medicines (immunosuppressants). ? Infection with HIV (human immunodeficiency virus). What are the signs or symptoms? Symptoms may be triggered by certain medicines and foods. Other factors that can trigger symptoms (trigger a spell) may include physical exertion, anxiety, stress, anesthesia, and changes in body position. Symptoms can also occur without a trigger. Symptoms of this condition include:  High blood pressure.  Shortness of breath.  Headaches.  Fever.  Nausea or vomiting.  Anxiety attacks.  A fast or  irregular heartbeat (palpitations).  Pale or clammy skin.  Heavy sweating. In some cases, there are no symptoms. How is this diagnosed? This condition may be diagnosed based on:  A physical exam and your medical history.  Urine tests.  Blood tests.  Imaging tests, such as: ? CT scan. ? MRI. ? X-ray. ? PET scan. ? M-iodobenzylguanidine (MIBG) or octreotide scan. These tests use radioactive substances to detect certain tumors. If you are diagnosed with pheochromocytoma, you may have other tests to see if you have a genetic syndrome that can raise your risk of developing diseases such as cancer. If the tumor is cancerous, diagnosis will also include determining whether the cancer has spread to other parts of the body (what stage it is). How is this treated? Treatment for this condition depends on whether the tumor is cancerous and the stage of the cancer. Treatment can include:  Using medicines to lower blood pressure prior to surgery.  Having surgery to remove the tumor and surrounding tissue.  Getting a type of radioactive iodine inserted into the body through an IV tube (radioactive iodine treatment). This substance will find cancer cells and kill them by giving off radiation.  Using high-energy X-rays or radiation to kill cancer cells or to slow their growth (radiation therapy).  Using medicines: ? To kill cancer cells or to slow their growth (chemotherapy). ? To attack a tumor's genes and proteins (targeted therapy). These medicines attack the genes and proteins that allow a tumor to grow while limiting damage to healthy cells.  Blocking the flow of blood to the tumor (embolization therapy). This helps to kill cancer cells.  Using radio waves to destroy cancer cells (radiofrequency ablation).  Freezing tissue to destroy cancer cells (cryoablation). Follow these instructions at home:  Learn about your disease and possible side effects from treatment. This will help you  talk about your choices with your health care provider and decide on treatments that are right for you.  Take over-the-counter and prescription medicines only as told by your health care provider.  Return to your normal activities as told by your health care provider. Ask your health care provider what activities are safe for you.  Eat a healthy diet. A healthy diet includes lots of fruits and vegetables, low-fat dairy products, lean meats, and fiber. ? Make sure half your plate is filled with fruits or vegetables. ? Choose high-fiber foods such as whole-grain breads and cereals.  Limit alcohol intake to no more than 1 drink a day for nonpregnant women and 2 drinks a day for men. One drink equals 12 oz of beer, 5 oz of wine, or 1 oz of hard liquor.  Get regular exercise. Aim for 30 minutes of moderate-intensity activity 5 times a week. Examples of moderate-intensity activity include walking and yoga. Be sure to talk with your health care provider before starting any exercise routine.  Keep all follow-up visits as told by your health care provider. This is important. Where to find more information  National Cancer Institute: www.cancer.gov  American Cancer Society: www.cancer.org  American Society of Clinical Oncology: www.cancer.net Contact a health care provider if:  You have abdominal pain or nausea.  You have diarrhea or a change in bowel movements.  You have swelling or redness anywhere, especially around a cut or wound.  You have a headache or sinus pain.  You have skin sores or a rash.  You have pain or burning when urinating.  You have unexplained weight loss. Get help right away if:  You have chest pain or shortness of breath.  You have a severe headache.  You have a fever.  You have blood in your urine.  You are confused.  You have swelling, pain, or tenderness in your arms or legs.  You have uncontrolled vomiting. Summary  A pheochromocytoma is a tumor  that forms in cells that produce and release hormones (neuroendocrine cells). These tumors are found in the center of the adrenal glands, which sit on top of the kidneys.  A pheochromocytoma creates excess hormones that can lead to symptoms such as high blood pressure and a fast heart rate.  You are more likely to develop this disease if you have certain inherited (genetic) syndromes.  Treatment for pheochromocytoma depends on whether the tumor is cancerous and whether it has spread to other parts of the body. This information is not intended to replace advice given to you by your health care provider. Make sure you discuss any questions you have with your health care provider. Document Released: 12/29/2015 Document Revised: 12/29/2015 Document Reviewed: 12/29/2015 Elsevier Interactive Patient Education  2019 [employer]

## 2018-06-20 NOTE — Progress Notes (Signed)
Virtual Visit  via Video Note  I connected with      Russell Ramos by a video enabled telemedicine application and verified that I am speaking with the correct person using two identifiers.   I discussed the limitations of evaluation and management by telemedicine and the availability of in person appointments. The patient expressed understanding and agreed to proceed.  History of Present Illness: Russell Ramos is a 43 y.o. male who would like to discuss chest pain.  Russell Ramos was seen on the 27th for recurrent episodes of bothersome chest pain.  These have been ongoing on and off for months.  He had work-up previously for this including EKG troponins and previous emergency room visits which have been normal.  He has a concerning past medical history for pulmonary embolism causing heart strain in the past.  He additionally has a history of panic disorder.  The symptoms were thought to be related to panic disorder however he notes his anxiety is relatively controlled and he still having very bothersome chest pain.  He notes he feels as though at times he is dying and notes they are very distressing.  He is concerned that perhaps his pain was due to gallstones or gallbladder etiology and had work-up this week including labs which were unremarkable,   He notes during these episodes he gets very flushed and feels very comfortable with chest pain and tachycardia.   Observations/Objective: Ht 5' 8.5" (1.74 m)   Wt 300 lb (136.1 kg)   BMI 44.95 kg/m  Wt Readings from Last 5 Encounters:  06/20/18 300 lb (136.1 kg)  06/16/18 (!) 306 lb (138.8 kg)  04/21/18 (!) 306 lb (138.8 kg)  03/31/18 (!) 302 lb (137 kg)  03/18/18 (!) 306 lb (138.8 kg)   Exam: Appearance nontoxic no acute distress Normal Speech.  Psych alert and oriented normal speech thought process and affect.  Lab and Radiology Results No results found for this or any previous visit (from the past 72 hour(s)). Nm  Hepatobiliary Including Gb  Result Date: 06/20/2018 CLINICAL DATA:  43 year old with generalized abdominal pain. EXAM: NUCLEAR MEDICINE HEPATOBILIARY IMAGING TECHNIQUE: Sequential images of the abdomen were obtained out to 60 minutes following intravenous administration of radiopharmaceutical. RADIOPHARMACEUTICALS:  5.3 mCi [id number]  Choletec IV COMPARISON:  Ultrasound 06/16/2018 FINDINGS: Prompt uptake and biliary excretion of activity by the liver is seen. Gallbladder activity is visualized, consistent with patency of cystic duct. Biliary activity passes into small bowel, consistent with patent common bile duct. Gallbladder ejection fraction could not be calculated because the gallbladder emptied so quickly during the examination. IMPRESSION: Normal hepatobiliary examination. Electronically Signed   By: Richarda OverlieAdam  Henn M.D.   On: 06/20/2018 08:53     Assessment and Plan: 43 y.o. male with  Chest pain episodes.  Etiology is unclear at this time.  He has had some work-up already.  He had a pretty extensive gallbladder work-up that I think conclusively rules out gallbladder is potential cause of his pain.  Differential does include cardiac etiology, esophageal spasm or gastritis or potentially rare conditions such as pheochromocytoma.  I think given his cardiac history it is reasonable to proceed with further cardiac evaluation.  He is already had some evaluation started with cardiology at [facility].  I think it is reasonable to proceed with further evaluation including stress test.  I have sent a letter to [facility]ologist at [facility] and will send a copy of today's note.  I also asked Russell Ramos contact  his cardiologist to get his stress test rescheduled.  Assuming his stress test comes back negative I think a reasonable next step would be evaluation for possible esophageal etiology with gastroenterology.  Additionally I will check a 24-hour urine metanephrine to evaluate for possible pheochromocytoma.  I think this  is pretty unlikely but would be reasonable to rule out.  PDMP not reviewed this encounter. Orders Placed This Encounter  Procedures  . Metanephrines, Urine, 24 hour   No orders of the defined types were placed in this encounter.   Follow Up Instructions:    I discussed the assessment and treatment plan with the patient. The patient was provided an opportunity to ask questions and all were answered. The patient agreed with the plan and demonstrated an understanding of the instructions.   The patient was advised to call back or seek an in-person evaluation if the symptoms worsen or if the condition fails to improve as anticipated.  Time: 25 minutes of intraservice time, with >39 minutes of total time during today's visit.     CC: Penni Bombard, MD  [address]  [place], [place] [address]  [phone]  [phone] (Fax)   Historical information moved to improve visibility of documentation.  Past Medical History:  Diagnosis Date  . Anxiety   . Chronic left shoulder pain   . Diabetes mellitus without complication (HCC)   . HTN (hypertension) 02/20/2013  . Hypertension   . PONV (postoperative nausea and vomiting)   . PTSD (post-traumatic stress disorder)   . Pulmonary embolism (HCC)   . Pulmonary hypertension (HCC)   . Sleep apnea    does not use CPAP   Past Surgical History:  Procedure Laterality Date  . KNEE SURGERY Left   . RADIOLOGY WITH ANESTHESIA Left 10/01/2017   Procedure: MRI OF LEFT SHOULDER WITH CONTRAST AND DG FLOURO GUIDE NEEDLE INJECTION;  Surgeon: Radiologist, Medication, MD;  Location: [facility] OR;  Service: Radiology;  Laterality: Left;   Social History   Tobacco Use  . Smoking status: Former Smoker    Packs/day: 1.00    Types: Cigarettes    Last attempt to quit: 09/12/2015    Years since quitting: 2.7  . Smokeless tobacco: Never Used  Substance Use Topics  . Alcohol use: Yes    Comment: socially   family history includes  Diabetes in his mother.  Medications: Current Outpatient Medications  Medication Sig Dispense Refill  . AMBULATORY NON FORMULARY MEDICATION Single glucometer with lancets, test strips. Use glucometer of choice Test daily.  Uncontrolled Diabetes E11.9 1 each 0  . atorvastatin (LIPITOR) 80 MG tablet TAKE 1 TABLET BY MOUTH EVERY DAY 90 tablet 1  . cholecalciferol (VITAMIN D) 1000 units tablet Take 1,000 Units by mouth daily.    . clonazePAM (KLONOPIN) 2 MG tablet Take 1 tablet by mouth 2 (two) times daily as needed. Usually once daily    . desvenlafaxine (PRISTIQ) 50 MG 24 hr tablet Take 1 tablet by mouth daily.    Marland Kitchen JARDIANCE 25 MG TABS tablet TAKE 1 TABLET BY MOUTH EVERY DAY 90 tablet 3  . lisinopril (PRINIVIL,ZESTRIL) 5 MG tablet TAKE 1 TABLET BY MOUTH EVERY DAY 90 tablet 1  . omeprazole (PRILOSEC) 40 MG capsule TAKE 1 CAPSULE (40 MG TOTAL) BY MOUTH DAILY. 90 capsule 3  . AMBULATORY NON FORMULARY MEDICATION Continuous positive airway pressure (CPAP) machine auto titrate to a max pressure of 15 cm of H2O pressure, with all supplemental supplies as needed. AHI 13.3 (Patient  not taking: Reported on 06/20/2018) 1 each 0  . glucose blood test strip USE TO TEST BLOOD SUGAR ONCE DAILY 100 each 0  . Lancets (ONETOUCH DELICA PLUS LANCET33G) MISC USE TO TEST BLOOD SUGAR ONCE DAILY 100 each 0  . SAFETY-LOK 3CC SYR 22GX1.5" 22G X 1-1/2" 3 ML MISC TO BE USED TO DRAW TESTOSTERONE FOR INJECTION 6 each 0   No current facility-administered medications for this visit.    Allergies  Allergen Reactions  . Lactose     Other reaction(s): GI Upset (intolerance)

## 2018-06-23 ENCOUNTER — Other Ambulatory Visit: Payer: Self-pay | Admitting: Family Medicine

## 2018-06-23 ENCOUNTER — Encounter: Payer: Self-pay | Admitting: Family Medicine

## 2018-06-23 DIAGNOSIS — Z8674 Personal history of sudden cardiac arrest: Secondary | ICD-10-CM

## 2018-06-23 HISTORY — DX: Personal history of sudden cardiac arrest: Z86.74

## 2018-06-23 NOTE — Progress Notes (Signed)
Copy of note faxed to [phone] with confirmation received.

## 2018-06-24 MED ORDER — CLONAZEPAM 0.5 MG PO TABS
0.50 | ORAL_TABLET | ORAL | Status: DC
Start: ? — End: 2018-06-24

## 2018-06-24 MED ORDER — MORPHINE SULFATE (PF) 2 MG/ML IV SOLN
2.00 | INTRAVENOUS | Status: DC
Start: ? — End: 2018-06-24

## 2018-06-24 MED ORDER — SODIUM CHLORIDE 0.9 % IV SOLN
10.00 | INTRAVENOUS | Status: DC
Start: ? — End: 2018-06-24

## 2018-06-24 MED ORDER — HYDROCODONE-ACETAMINOPHEN 5-325 MG PO TABS
1.00 | ORAL_TABLET | ORAL | Status: DC
Start: ? — End: 2018-06-24

## 2018-06-24 MED ORDER — VERAPAMIL HCL 2.5 MG/ML IV SOLN
INTRAVENOUS | Status: DC
Start: ? — End: 2018-06-24

## 2018-06-24 MED ORDER — CALCIUM CARBONATE-VITAMIN D 600-400 MG-UNIT PO TABS
1.00 | ORAL_TABLET | ORAL | Status: DC
Start: 2018-06-27 — End: 2018-06-24

## 2018-06-24 MED ORDER — ACETAMINOPHEN 325 MG PO TABS
650.00 | ORAL_TABLET | ORAL | Status: DC
Start: ? — End: 2018-06-24

## 2018-06-24 MED ORDER — INSULIN LISPRO 100 UNIT/ML ~~LOC~~ SOLN
1.00 | SUBCUTANEOUS | Status: DC
Start: 2018-06-26 — End: 2018-06-24

## 2018-06-24 MED ORDER — ASPIRIN EC 81 MG PO TBEC
81.00 | DELAYED_RELEASE_TABLET | ORAL | Status: DC
Start: 2018-06-27 — End: 2018-06-24

## 2018-06-24 MED ORDER — PROPRANOLOL HCL 10 MG PO TABS
10.00 | ORAL_TABLET | ORAL | Status: DC
Start: 2018-06-26 — End: 2018-06-24

## 2018-06-24 MED ORDER — INSULIN GLARGINE 100 UNIT/ML ~~LOC~~ SOLN
1.00 | SUBCUTANEOUS | Status: DC
Start: ? — End: 2018-06-24

## 2018-06-24 MED ORDER — MIDAZOLAM HCL 2 MG/2ML IJ SOLN
INTRAMUSCULAR | Status: DC
Start: ? — End: 2018-06-24

## 2018-06-24 MED ORDER — ALUM & MAG HYDROXIDE-SIMETH 200-200-20 MG/5ML PO SUSP
30.00 | ORAL | Status: DC
Start: ? — End: 2018-06-24

## 2018-06-24 MED ORDER — GENERIC EXTERNAL MEDICATION
Status: DC
Start: ? — End: 2018-06-24

## 2018-06-24 MED ORDER — LIDOCAINE HCL 2 % IJ SOLN
INTRAMUSCULAR | Status: DC
Start: ? — End: 2018-06-24

## 2018-06-24 MED ORDER — NITROGLYCERIN 0.4 MG SL SUBL
0.40 | SUBLINGUAL_TABLET | SUBLINGUAL | Status: DC
Start: ? — End: 2018-06-24

## 2018-06-24 MED ORDER — HYDRALAZINE HCL 20 MG/ML IJ SOLN
10.00 | INTRAMUSCULAR | Status: DC
Start: ? — End: 2018-06-24

## 2018-06-24 MED ORDER — MUPIROCIN 2 % EX OINT
TOPICAL_OINTMENT | CUTANEOUS | Status: DC
Start: 2018-06-26 — End: 2018-06-24

## 2018-06-24 MED ORDER — HEPARIN SODIUM (PORCINE) 1000 UNIT/ML IJ SOLN
INTRAMUSCULAR | Status: DC
Start: ? — End: 2018-06-24

## 2018-06-24 MED ORDER — PANTOPRAZOLE SODIUM 40 MG PO TBEC
40.00 | DELAYED_RELEASE_TABLET | ORAL | Status: DC
Start: 2018-06-27 — End: 2018-06-24

## 2018-06-24 MED ORDER — INSULIN GLARGINE 100 UNIT/ML ~~LOC~~ SOLN
1.00 | SUBCUTANEOUS | Status: DC
Start: 2018-06-26 — End: 2018-06-24

## 2018-06-24 MED ORDER — FENTANYL CITRATE (PF) 2500 MCG/50ML IJ SOLN
INTRAMUSCULAR | Status: DC
Start: ? — End: 2018-06-24

## 2018-06-24 MED ORDER — LISINOPRIL 5 MG PO TABS
5.00 | ORAL_TABLET | ORAL | Status: DC
Start: 2018-06-27 — End: 2018-06-24

## 2018-06-24 MED ORDER — DESVENLAFAXINE SUCCINATE ER 50 MG PO TB24
50.00 | ORAL_TABLET | ORAL | Status: DC
Start: 2018-06-27 — End: 2018-06-24

## 2018-06-24 MED ORDER — INSULIN LISPRO 100 UNIT/ML ~~LOC~~ SOLN
1.00 | SUBCUTANEOUS | Status: DC
Start: ? — End: 2018-06-24

## 2018-06-24 MED ORDER — HEPARIN SOD (PORCINE) IN D5W 100 UNIT/ML IV SOLN
1.00 | INTRAVENOUS | Status: DC
Start: ? — End: 2018-06-24

## 2018-06-24 MED ORDER — ATORVASTATIN CALCIUM 80 MG PO TABS
80.00 | ORAL_TABLET | ORAL | Status: DC
Start: 2018-06-26 — End: 2018-06-24

## 2018-06-25 ENCOUNTER — Ambulatory Visit: Payer: Self-pay | Admitting: Family Medicine

## 2018-06-27 ENCOUNTER — Telehealth: Payer: Self-pay

## 2018-06-27 MED ORDER — ALPRAZOLAM 0.5 MG PO TABS
.50 | ORAL_TABLET | ORAL | Status: DC
Start: ? — End: 2018-06-27

## 2018-06-27 MED ORDER — GENERIC EXTERNAL MEDICATION
Status: DC
Start: ? — End: 2018-06-27

## 2018-06-27 MED ORDER — IOPAMIDOL (ISOVUE-370) INJECTION 76%
INTRAVENOUS | Status: DC
Start: ? — End: 2018-06-27

## 2018-06-27 NOTE — Telephone Encounter (Signed)
Russell Ramos's wife called and reports he is having surgical site pain. He does have a follow up hospital visit on Monday. He was not given pain medications on discharge. He was advised to follow up with PCP. He was advised to continue pain medication. He hasn't had pain medication since his knee surgery in a while. Please advise.

## 2018-06-27 NOTE — Telephone Encounter (Signed)
I called Dennard Nip back. His surgeon called in pain medicine. Will follow up monday

## 2018-06-30 ENCOUNTER — Encounter: Payer: Self-pay | Admitting: Family Medicine

## 2018-06-30 ENCOUNTER — Telehealth (INDEPENDENT_AMBULATORY_CARE_PROVIDER_SITE_OTHER): Payer: BLUE CROSS/BLUE SHIELD | Admitting: Family Medicine

## 2018-06-30 VITALS — BP 124/74 | HR 63 | Temp 97.9°F | Wt 308.0 lb

## 2018-06-30 DIAGNOSIS — F410 Panic disorder [episodic paroxysmal anxiety] without agoraphobia: Secondary | ICD-10-CM | POA: Diagnosis not present

## 2018-06-30 DIAGNOSIS — R079 Chest pain, unspecified: Secondary | ICD-10-CM

## 2018-06-30 MED ORDER — OXYCODONE-ACETAMINOPHEN 5-325 MG PO TABS: 1.0000 | ORAL_TABLET | Freq: Three times a day (TID) | ORAL | 0 refills | Status: AC | PRN

## 2018-06-30 NOTE — Patient Instructions (Addendum)
Thank you for coming in today. Continue current medicine.  I will ask your psychiatrist this week if we can increase the prestiq.  If no response I will increase the dose.   Lets check via video visit in 3 weeks or sooner if needed.     Your current symptoms are not OK.

## 2018-06-30 NOTE — Progress Notes (Addendum)
Russell Ramos is a 43 y.o. male who presents to [facility]: [facility] today for hospital follow-up.  Patient had cardiac arrest due to ventricular fibrillation requiring 1 shock.  He had left heart catheterization on May 4 that showed no acute plaque rupture.  At that time his EF was about 40 to 45%.  He had single chamber ICD placed on May 7.  The etiology of the ventricular fibrillation and subsequent NSTEMI was unclear.  Since the incident he continues to experience chest pain and anxiety.  He notes that some of his chest pain is probably attributable to his CPR as well as his ICD placement.  However he still is having similar incidents than he was before his hospitalization.  Some of these incidents have been captured on telemetry and he notes that he was told that his heart was acting normally during these episodes.  [facility]  Who continued his Pristiq and Klonopin.  He notes he is having quite a bit of anxiety especially after his V. fib episode.  During his hospitalization his propranolol switched to metoprolol.  Otherwise his medicines are pretty much unchanged.  Exam:  BP 124/74    Pulse 63    Temp 97.9 F (36.6 C) (Oral)    Wt (!) 308 lb (139.7 kg)    BMI 46.15 kg/m  Wt Readings from Last 5 Encounters:  06/30/18 (!) 308 lb (139.7 kg)  06/20/18 300 lb (136.1 kg)  06/16/18 (!) 306 lb (138.8 kg)  04/21/18 (!) 306 lb (138.8 kg)  03/31/18 (!) 302 lb (137 kg)    Gen: Well NAD HEENT: EOMI,  MMM Lungs: Normal work of breathing. CTABL Heart: RRR no MRG Abd: NABS, Soft. Nondistended, Nontender Exts: Brisk capillary refill, warm and well perfused.  Skin: Left chest wall incision for ICD well-appearing with no erythema.  Mildly tender to palpation surrounding ICD pocket. Psych alert and oriented anxious appearing  no SI or HI expressed.  Lab and Radiology Results Reviewed labs imaging tests and reports and discharge summary from hospitalization.  Attached below   Assessment and Plan: 43 y.o. male with ventricular fibrillation episode: Etiology at this time remains unclear.  Plan to work continuously with cardiology to monitor his current symptoms and change medications as needed.  In the interim no change to his current medications.  Chest pain from ICD placement: Plan to refill oxycodone.  Recheck in near future.  Follow-up in about 3 weeks.  Anxiety and panic attacks: Patient is still having similar panic attack episodes prior to his hospitalization.  He is followed up with his psychiatrist and his medications have not changed.  He is not doing well overall.  I think it is reasonable to try increasing Pristiq but would like to discuss with his psychiatrist first.  I have sent him a letter through the secure messaging platform [facility].  If no response by Friday we will go ahead and increase Pristiq.  Addendum I spoke with his psychiatrist today.  Plans to try switching to Viibryd and will contact the patient and prescribe Viibryd.  PDMP reviewed during this encounter. No orders of the defined types were placed in this encounter.  Meds ordered this encounter  Medications   oxyCODONE-acetaminophen (PERCOCET/ROXICET) 5-325 MG tablet    Sig: Take 1 tablet by mouth every 8 (eight) hours as needed for  up to 3 days for severe pain.    Dispense:  15 tablet    Refill:  0     Historical information moved to improve visibility of documentation.  Past Medical History:  Diagnosis Date   Anxiety    Chronic left shoulder pain    Diabetes mellitus without complication (HCC)    HTN (hypertension) 02/20/2013   Hypertension    PONV (postoperative nausea and vomiting)    PTSD (post-traumatic stress disorder)    Pulmonary embolism (HCC)    Pulmonary hypertension  (HCC)    Sleep apnea    does not use CPAP   Past Surgical History:  Procedure Laterality Date   KNEE SURGERY Left    RADIOLOGY WITH ANESTHESIA Left 10/01/2017   Procedure: MRI OF LEFT SHOULDER WITH CONTRAST AND DG FLOURO GUIDE NEEDLE INJECTION;  Surgeon: Radiologist, Medication, MD;  Location: [facility];  Service: Radiology;  Laterality: Left;   Social History   Tobacco Use   Smoking status: Former Smoker    Packs/day: 1.00    Types: Cigarettes    Last attempt to quit: 09/12/2015    Years since quitting: 2.8   Smokeless tobacco: Never Used  Substance Use Topics   Alcohol use: Yes    Comment: socially   family history includes Diabetes in his mother.  Medications: Current Outpatient Medications  Medication Sig Dispense Refill   AMBULATORY NON FORMULARY MEDICATION Continuous positive airway pressure (CPAP) machine auto titrate to a max pressure of 15 cm of H2O pressure, with all supplemental supplies as needed. AHI 13.3 (Patient not taking: Reported on 06/20/2018) 1 each 0   AMBULATORY NON FORMULARY MEDICATION Single glucometer with lancets, test strips. Use glucometer of choice Test daily.  Uncontrolled Diabetes E11.9 1 each 0   ASPIRIN LOW DOSE 81 MG EC tablet      atorvastatin (LIPITOR) 80 MG tablet TAKE 1 TABLET BY MOUTH EVERY DAY 90 tablet 1   cholecalciferol (VITAMIN D) 1000 units tablet Take 1,000 Units by mouth daily.     clonazePAM (KLONOPIN) 2 MG tablet Take 1 tablet by mouth 2 (two) times daily as needed. Usually once daily     desvenlafaxine (PRISTIQ) 50 MG 24 hr tablet Take 1 tablet by mouth daily.     glucose blood test strip USE TO TEST BLOOD SUGAR ONCE DAILY 100 each 0   JARDIANCE 25 MG TABS tablet TAKE 1 TABLET BY MOUTH EVERY DAY 90 tablet 3   Lancets (ONETOUCH DELICA PLUS [id number]) MISC USE TO TEST BLOOD SUGAR ONCE DAILY 100 each 0   lisinopril (PRINIVIL,ZESTRIL) 5 MG tablet TAKE 1 TABLET BY MOUTH EVERY DAY 90 tablet 1   metoprolol succinate  (TOPROL-XL) 25 MG 24 hr tablet      omeprazole (PRILOSEC) 40 MG capsule TAKE 1 CAPSULE (40 MG TOTAL) BY MOUTH DAILY. 90 capsule 3   oxyCODONE-acetaminophen (PERCOCET/ROXICET) 5-325 MG tablet Take 1 tablet by mouth every 8 (eight) hours as needed for up to 3 days for severe pain. 15 tablet 0   SAFETY-LOK 3CC SYR 22GX1.5" 22G X 1-1/2" 3 ML MISC TO BE USED TO DRAW 1ML TESTOSTERONE FOR INJECTION 6 each 0   No current facility-administered medications for this visit.    Allergies  Allergen Reactions   Lactose     Other reaction(s): GI Upset (intolerance)     Discussed warning signs or symptoms. Please see discharge instructions. Patient expresses understanding.  Discharge summary Discharge Summaries - documented in this encounter McGeathy, Marylyn Ishihara, FNP -  06/26/2018 10:48 AM EDT Formatting of this note might be different from the original. [place]  PCP: Gregor Hams, MD Discharge Details   Admit date: 06/23/2018 Discharge date: 06/26/2018   Discharge Diagnoses: Active Hospital Problems  Diagnosis Date Noted POA  ? STEMI (ST elevation myocardial infarction) (*) 06/23/2018 Yes  ? Metabolic acidosis 93/81/0175 Yes  ? Cardiac arrest with ventricular fibrillation (*) 06/23/2018 Yes  ? Pulmonary hypertension (*) 08/27/2014 Yes  ? Morbid obesity with BMI 45.10 kg/m 12/13/8525 Not Applicable  ? Type 2 diabetes mellitus with hyperglycemia (*) 03/07/2011 Yes   Resolved Hospital Problems  No resolved problems to display.    Hewlett Ramos 43 y.o. male with a past medical history of anxiety, obesity, diabetes, and pulmonary hypertension. Presented to the hospital after having a VF arrest requiring one shock. He underwent a LHC on 06/23/18 that demonstrated no acute plaque rupture. His EF on 06/23/18 is 40-45%. He underwent placement of a single chamber ICD to the left side on 06/26/18.  This morning device interrogation indicates normal  device function and stable lead paramters, CXR shows no pneumothorax and stable lead position, and the incision is dry and intact without signs of hematoma/infection. The patient should follow up in wound clinic at [facility] in 1 week and in device clinic at [facility] in 6 weeks. Our office will contact the patient to arrange these appointment(s).  Cardiac Profile: 1. Out of hospital arrest  - V-fib arrest per EMS requiring X1 shock ?????- Repeat EKG showed ST elevation in inferior leads  ?????- LHC 06/23/2018 did not demonstrate acute plaque rapture - status post implantation of a Bi[employer]ron[employer] single-chamber ICD on the left side. Programmed to VDI with a lower rate of 40 bpm. He has a PADIT  2. Nonobstructive coronary artery disease 3. Cardiomyopathy  - Echo 06/23/2018: EF 40-45%, anteroseptal and apical wall hypokinesis Hx: pulmonary embolism  ?????- CT angio pulmonary 06/23/2018 showed no evidence of pulmonary embolism 4. Hyperlipidemia  5. Diabetes Mellitus type II 6. Obesity 7. Obstructive sleep apnea  Pertinent Procedures and Tests: Lab Results  Component Value Date  Hemoglobin A1c 7.4 (H) 06/23/2018  Cholesterol, Total 179 08/03/2014  HDL 23 (L) 08/03/2014  LDL Cholesterol 109 08/03/2014  Triglycerides 236 (H) 08/03/2014  WBC 11.8 (H) 06/26/2018  HGB 15.0 06/26/2018  Plt Ct 356 06/26/2018  Creatinine 1.00 06/26/2018  BUN 15 06/26/2018  Na 139 06/26/2018  Potassium 4.1 06/26/2018  Cl 100 06/26/2018  CO2 28 06/26/2018  INR 1.2 06/23/2018  TSH 1.49 06/24/2018  ALT 18 06/23/2018  AST 29 06/23/2018  ALK PHOS 76 06/23/2018  T Bili 0.19 06/23/2018   No results found for: BNP  CK-MB  Date Value Ref Range Status  08/03/2014 8.58 0.00 - 10.40 ng/mL Final   CKMB  Date Value Ref Range Status  06/23/2018 8.31 0.00 - 10.40 ng/mL Final   CK  Date Value Ref Range Status  06/23/2018 136 24 - 204 IU/L Final  08/03/2014 125 24 - 204  IU/L Final   Troponin T  Date Value Ref Range Status  08/03/2014 0.298 (H) <=0.009 ng/mL Final  Comment:  Troponin not required to be called due to prior elevated result notified by the laboratory.A Cardiac Troponin T level of 0.010 ng/mL or greater indicates myocardial damage. Elevated troponin may  also be due to pulmonary emboli, aortic dissection, heart  failure, renal failure, inflammation , trauma, toxins, etc., and ischemia in  the setting of critical illness.   Trop T  Date Value Ref Range Status  06/24/2018 0.204 (H) <0.010 Final  Comment:  A Cardiac Troponin T level of 0.010 ng/mL or greater indicates myocardial damage. Elevated troponin may  also be due to pulmonary emboli, aortic dissection, heart  failure, renal failure, inflammation , trauma, toxins, etc., and ischemia in the setting of critical illness.  Troponin not required to be called due to prior elevated result notified by the laboratory.   Echocardiogram: Echocardiogram 2d Complete With Contrast  Addendum Date: 06/23/2018  Version [facility] [address] +-----------------+ +------------------------------------+ [facility]: : : [address],: : : : [place] [address] : : : : [phone] : : : Fax [phone] +------------------------------------+ [phone] : : +-----------------+ Echocardiogram Report +-------------------------------------------------------------------------- + :Name: Casimiro Needle, Ramos Study Date: 06/23/2018 01:29 PM : :Patient Location: [place] C[facility]^CIC1 5162-01 BP: 116/84 mmHg : :MRN: [id number] : :DOB: 02/17/1976 Height: 68.5 in : :Gender: Male Weight: 309 lb : :Race: OTHER : :Age: 65 yrs BSA: 2.5 m2 : :Reason For Study: CHEST PAIN : :History: STEMI, DM, HLD, PE, FORMER SMOKER. : :Referring Physician: Georgina Snell, Carlin Mamone : :Performed By: Judene Companion : +-------------------------------------------------------------------------- + Interpretation Summary A complete portable two-dimensional  transthoracic echocardiogram with color flow Doppler and Spectral Doppler was performed. The study was technically difficult. Definity contrast injection performed. The left ventricle is grossly normal size. The left ventricular ejection fraction is moderately reduced (40-45%). There is a small pericardial effusion without echocardiographic evidence of tamponade. There is normal left ventricular wall thickness. Unable to adequately determine diastolic dysfunction. Left Ventricle The left ventricle is grossly normal size. There is normal left ventricular wall thickness. The left ventricular ejection fraction is moderately reduced (40-45%). There is anteroseptal and apical wall hypokinesis. Unable to adequately determine diastolic dysfunction. Right Ventricle The right ventricle is grossly normal in size and function. Atria The left and right atria are normal size. Mitral Valve The mitral valve is grossly normal. There is no mitral valve stenosis. There is trace mitral regurgitation. Tricuspid Valve The tricuspid valve is grossly normal. There is trace tricuspid regurgitation. Aortic Valve The aortic valve opens well. There is no aortic stenosis. There is no aortic regurgitation present. Pulmonic Valve The pulmonic valve is not well visualized. There is no pulmonic regurgitation. Vessels The aortic root is normal in diameter. Pericardium There is a small pericardial effusion without echocardiographic evidence of tamponade. MMode/2D Measurements & Calculations IVSd: 1.6 cm LVIDd: 4.2 cm LVIDs: 3.3 cm LVPWd: 1.6 cm _____________________________________________________________ LV mass(C)d: 267.5 grams EDV(sp4-el): 137.3 ml LV mass(C)dI: 108.2 grams/m2 LVAs ap4: 24.8 cm2 LVLs ap4: 7.1 cm ESV(MOD-sp4): 70.8 ml ESV(sp4-el): 73.4 ml _____________________________________________________________ SV(MOD-sp4): 60.3 ml SV(sp4-el): 64.0 ml Doppler Measurements & Calculations MV E max vel: 62.6 cm/sec MV dec slope: 306.4 cm/sec2 MV  A max vel: 69.8 cm/sec MV dec time: 0.20 sec MV E/A: 0.90 ___________________________________________________________________________ _ Electronically signed [id number] D Noureddine on 06/23/2018 03:54 PM Ordering Physician: [id number]^^^^^[id number]   Result Date: 06/23/2018 [facility] [address] +-----------------+ +------------------------------------+ [facility]: : : [address],: : : : [place] [address] : : : : [phone]: : : : Fax [phone] +------------------------------------+ [phone] : : +-----------------+ Echocardiogram Report +--------------------------------------------------------------------------+ :Name: Casimiro Needle, Ramos Study Date: 06/23/2018 01:29 PM : :Patient Location: [facility] C[facility]^CIC1 1157-26 BP: 116/84 mmHg : :MRN: [id number] : :DOB: [birth year] Height: 68.5 in : :Gender: Male Weight: 309 lb : :Race: OTHER : :Age: 85 yrs  BSA: 2.5 m2 : :Reason For Study: CHEST PAIN : :History: STEMI, DM, HLD, PE, FORMER SMOKER. : :Referring Physician: Lynne Leader : :Performed By: Judene Companion : +--------------------------------------------------------------------------+ Interpretation Summary A complete portable two-dimensional transthoracic echocardiogram with color flow Doppler and Spectral Doppler was performed. The study was technically difficult. Definity contrast injection performed. The left ventricle is grossly normal size. The left ventricular ejection fraction is moderately reduced (40-45%). There is a small pericardial effusion without echocardiographic evidence of tamponade. There is normal left ventricular wall thickness. Unable to adequately determine diastolic dysfunction. Left Ventricle The left ventricle is grossly normal size. There is normal left ventricular wall thickness. The left ventricular ejection fraction is moderately reduced (40-45%). There is anteroseptal and apical wall hypokinesis. Unable to adequately determine diastolic dysfunction. Right Ventricle The  right ventricle is grossly normal in size and function. Atria The left and right atria are normal size. Mitral Valve The mitral valve is grossly normal. There is no mitral valve stenosis. There is trace mitral regurgitation. Tricuspid Valve The tricuspid valve is grossly normal. There is trace tricuspid regurgitation. Aortic Valve The aortic valve opens well. There is no aortic stenosis. There is no aortic regurgitation present. Pulmonic Valve The pulmonic valve is not well visualized. There is no pulmonic regurgitation. Vessels The aortic root is normal in diameter. Pericardium There is a small pericardial effusion without echocardiographic evidence of tamponade. MMode/2D Measurements & Calculations IVSd: 1.6 cm LVIDd: 4.2 cm LVIDs: 3.3 cm LVPWd: 1.6 cm _____________________________________________________________ LV mass(C)d: 267.5 grams EDV(sp4-el): 137.3 ml LV mass(C)dI: 108.2 grams/m2 LVAs ap4: 24.8 cm2 LVLs ap4: 7.1 cm ESV(MOD-sp4): 70.8 ml ESV(sp4-el): 73.4 ml _____________________________________________________________ SV(MOD-sp4): 60.3 ml SV(sp4-el): 64.0 ml Doppler Measurements & Calculations MV E max vel: 62.6 cm/sec MV dec slope: 306.4 cm/sec2 MV A max vel: 69.8 cm/sec MV dec time: 0.20 sec MV E/A: 0.90 ____________________________________________________________________________ Electronically signed [id number]:GNFAO D Noureddine on 06/23/2018 03:53 PM Ordering Physician: [id number]^^^^^[id number]   Discharge Medications   Current Discharge Medication List   DISCONTINUED medications   naproxen (NAPROSYN) 500 mg tablet   propranolol HCl (INDERAL) 20 mg tablet    NEW medications  Details  aspirin 81 MG EC tablet Take one tablet (81 mg dose) by mouth daily. Start date: 06/27/2018, End date: 06/27/2019   metoprolol succinate (TOPROL XL) 25 mg 24 hr tablet Take one tablet (25 mg dose) by mouth daily. Start date: 06/26/2018   nitroGLYCERIN (NITROSTAT) 0.4 mg SL tablet Place one tablet (0.4 mg  dose) under the tongue every 5 (five) minutes as needed for Chest pain. Start date: 06/26/2018    CHANGED medications  Details  lisinopril (PRINIVIL,ZESTRIL) 5 mg tablet Take one tablet (5 mg dose) by mouth daily. Start date: 06/27/2018    CONTINUED medications  Details  atorvastatin (LIPITOR) 80 mg tablet TAKE 1 TABLET BY MOUTH EVERY DAY   Cholecalciferol (VITAMIN D-1000 MAX ST) 25 MCG (1000 UT) tablet Take by mouth.   clonazePAM (KLONOPIN) 1 mg tablet Take 1 mg by mouth.   Dulaglutide (TRULICITY) 1.5 NU/2.7OZ SOPN Inject 1.5 mg into the skin.   DULoxetine HCl (CYMBALTA) 30 mg capsule Take 30 mg by mouth.   JARDIANCE 25 MG TABS tablet Take 25 mg by mouth daily.   Misc. Devices (BARIATRIC ROLLATOR) MISC 1 each by Does not apply route daily as needed.   omeprazole (PRILOSEC) 40 mg capsule Take 40 mg by mouth daily.   oxyCODONE HCl (OXYCONTIN) 20 mg 12 hr tablet Take 20 mg by mouth every 12 (twelve)  hours.   tiZANidine (ZANAFLEX) 4 mg tablet Take 4 mg by mouth every 6 (six) hours as needed.     Discharge Plan  The patient should follow up in wound clinic at [facility] in 1 week and in device clinic at [facility] in 6 weeks. Our office will contact the patient to arrange these appointment(s).  Time spent in discharge process: 33 minutes Electronically signed: Shaune Leeks, [facility] 06/26/2018 / 10:57 AM   Electronically signed by Dot Been, MD at 06/26/2018 11:38 AM EDT

## 2018-07-02 ENCOUNTER — Ambulatory Visit: Payer: Self-pay | Admitting: Family Medicine

## 2018-07-02 ENCOUNTER — Telehealth: Payer: BLUE CROSS/BLUE SHIELD | Admitting: Family Medicine

## 2018-07-10 ENCOUNTER — Encounter: Payer: Self-pay | Admitting: Family Medicine

## 2018-07-14 ENCOUNTER — Encounter: Payer: Self-pay | Admitting: Family Medicine

## 2018-07-14 ENCOUNTER — Other Ambulatory Visit: Payer: Self-pay | Admitting: Family Medicine

## 2018-07-17 LAB — METANEPHRINES, URINE, 24 HOUR
Metaneph Total, Ur: 269 mcg/24 h (ref 182–739)
Metanephrines, Ur: 129 mcg/24 h (ref 58–203)
Normetanephrine, 24H Ur: 140 mcg/24 h (ref 88–649)
Volume, Urine-VMAUR: 3050 mL

## 2018-07-22 ENCOUNTER — Encounter: Payer: Self-pay | Admitting: Family Medicine

## 2018-07-22 ENCOUNTER — Telehealth (INDEPENDENT_AMBULATORY_CARE_PROVIDER_SITE_OTHER): Payer: BLUE CROSS/BLUE SHIELD | Admitting: Family Medicine

## 2018-07-22 VITALS — BP 131/83 | HR 74 | Wt 300.0 lb

## 2018-07-22 DIAGNOSIS — R079 Chest pain, unspecified: Secondary | ICD-10-CM

## 2018-07-22 DIAGNOSIS — F410 Panic disorder [episodic paroxysmal anxiety] without agoraphobia: Secondary | ICD-10-CM

## 2018-07-22 NOTE — Progress Notes (Signed)
Virtual Visit  via Video Note  I connected with      Russell Ramos by a video enabled telemedicine application and verified that I am speaking with the correct person using two identifiers.   I discussed the limitations of evaluation and management by telemedicine and the availability of in person appointments. The patient expressed understanding and agreed to proceed.  History of Present Illness: Russell Ramos is a 43 y.o. male who would like to discuss chest pain and anxiety.  Patient had a very scary episode about a month ago.  He had a witness ventricular fibrillation episode after experiencing chest pain in his home.  Fortunately EMS had been called prior to him developing V. fib.  He was successfully treated with defibrillation and chest compressions.  In the emergency room he was stabilized and had extensive cardiac work-up which showed an EF of about 40 to 5045% but no obvious coronary artery disease causing his arrhythmia.  He was seen by me on May 11 following his discharge.  He continued to have some pain in his chest that was thought to be due to the ICD placement.   Additionally he had continued severe anxiety.  His medications were switched to Viibryd and he followed up with a psychiatrist.  In the interim he notes that anxiety is better controlled on higher dose of viibryd. Chest pain is also much better controlled.   BP cuff when having chest pain episodes showed lower BP at 90s/40s. And was having irregular beats on the BP cuff. This occurred at 633pm 07/15/18.   Observations/Objective: BP 131/83   Pulse 74   Wt 300 lb (136.1 kg)   BMI 44.95 kg/m  Wt Readings from Last 5 Encounters:  07/22/18 300 lb (136.1 kg)  06/30/18 (!) 308 lb (139.7 kg)  06/20/18 300 lb (136.1 kg)  06/16/18 (!) 306 lb (138.8 kg)  04/21/18 (!) 306 lb (138.8 kg)   Exam: Appearance nontoxic no acute distress. Normal Speech.  Psych alert and oriented normal speech thought process and  affect.  Depression screen [facility] 2/9 07/22/2018 04/21/2018 03/31/2018 03/18/2018 08/29/2016  Decreased Interest 1 0 1 1 0  Down, Depressed, Hopeless 2 0 1 1 0  PHQ - 2 Score 3 0 2 2 0  Altered sleeping 0 3 3 3  0  Tired, decreased energy 1 0 0 0 0  Change in appetite 0 0 0 0 0  Feeling bad or failure about yourself  1 0 1 1 0  Trouble concentrating 0 1 2 1  0  Moving slowly or fidgety/restless 0 0 - 0 0  Suicidal thoughts 0 0 0 0 0  PHQ-9 Score 5 4 8 7  0  Difficult doing work/chores Somewhat difficult Somewhat difficult Somewhat difficult Not difficult at all -  Some recent data might be hidden   GAD 7 : Generalized Anxiety Score 07/22/2018 04/21/2018 03/31/2018 03/18/2018  Nervous, Anxious, on Edge 3 1 3 1   Control/stop worrying 3 1 3 1   Worry too much - different things 0 0 3 2  Trouble relaxing 1 1 3 1   Restless 0 0 0 0  Easily annoyed or irritable 2 1 3 3   Afraid - awful might happen 3 1 3 3   Total GAD 7 Score 12 5 18 11   Anxiety Difficulty Somewhat difficult Somewhat difficult Somewhat difficult Very difficult       Assessment and Plan: 43 y.o. male with multiple medical problems.  Chest pain/recent history of V.  fib.  Much better controlled however fundamental cause of arrhythmia is still somewhat uncertain.  Patient is overall improved but still having episodes of chest pain associated with some palpitations.  He has been measuring his blood pressure during the episodes and is having what seems to be hypotensive episodes associated with with the blood pressure cuff reports is an irregular heartbeat.  Plan to correlate this with his device during his upcoming cardiology device check.  This should help determine what the palpitation episodes and chest pain episodes are.  Anxiety is also significantly better controlled on Viibryd.  Plan to continue current regimen.  Recheck in about 3 months.  Will reassess diabetes and hypertension at that time.  Return sooner if needed.  PDMP not  reviewed this encounter. No orders of the defined types were placed in this encounter.  No orders of the defined types were placed in this encounter.   Follow Up Instructions:    I discussed the assessment and treatment plan with the patient. The patient was provided an opportunity to ask questions and all were answered. The patient agreed with the plan and demonstrated an understanding of the instructions.   The patient was advised to call back or seek an in-person evaluation if the symptoms worsen or if the condition fails to improve as anticipated.  Time: 15 minutes of intraservice time, with >22 minutes of total time during today's visit.      Historical information moved to improve visibility of documentation.  Past Medical History:  Diagnosis Date  . Anxiety   . Chronic left shoulder pain   . Diabetes mellitus without complication (HCC)   . HTN (hypertension) 02/20/2013  . Hypertension   . PONV (postoperative nausea and vomiting)   . PTSD (post-traumatic stress disorder)   . Pulmonary embolism (HCC)   . Pulmonary hypertension (HCC)   . Sleep apnea    does not use CPAP   Past Surgical History:  Procedure Laterality Date  . KNEE SURGERY Left   . RADIOLOGY WITH ANESTHESIA Left 10/01/2017   Procedure: MRI OF LEFT SHOULDER WITH CONTRAST AND DG FLOURO GUIDE NEEDLE INJECTION;  Surgeon: Radiologist, Medication, MD;  Location: [facility] OR;  Service: Radiology;  Laterality: Left;   Social History   Tobacco Use  . Smoking status: Former Smoker    Packs/day: 1.00    Types: Cigarettes    Last attempt to quit: 09/12/2015    Years since quitting: 2.8  . Smokeless tobacco: Never Used  Substance Use Topics  . Alcohol use: Yes    Comment: socially   family history includes Diabetes in his mother.  Medications: Current Outpatient Medications  Medication Sig Dispense Refill  . AMBULATORY NON FORMULARY MEDICATION Continuous positive airway pressure (CPAP) machine auto titrate to a max  pressure of 15 cm of H2O pressure, with all supplemental supplies as needed. AHI 13.3 (Patient not taking: Reported on 06/20/2018) 1 each 0  . AMBULATORY NON FORMULARY MEDICATION Single glucometer with lancets, test strips. Use glucometer of choice Test daily.  Uncontrolled Diabetes E11.9 1 each 0  . ASPIRIN LOW DOSE 81 MG EC tablet     . atorvastatin (LIPITOR) 80 MG tablet TAKE 1 TABLET BY MOUTH EVERY DAY 90 tablet 1  . cholecalciferol (VITAMIN D) 1000 units tablet Take 1,000 Units by mouth daily.    . clonazePAM (KLONOPIN) 2 MG tablet Take 1 tablet by mouth 2 (two) times daily as needed. Usually once daily    . desvenlafaxine (PRISTIQ) 50 MG 24  hr tablet Take 1 tablet by mouth daily.    Marland Kitchen glucose blood test strip USE TO TEST BLOOD SUGAR ONCE DAILY 100 each 0  . JARDIANCE 25 MG TABS tablet TAKE 1 TABLET BY MOUTH EVERY DAY 90 tablet 3  . Lancets (ONETOUCH DELICA PLUS LANCET33G) MISC USE TO TEST BLOOD SUGAR ONCE DAILY 100 each 0  . lisinopril (PRINIVIL,ZESTRIL) 5 MG tablet TAKE 1 TABLET BY MOUTH EVERY DAY 90 tablet 1  . metoprolol succinate (TOPROL-XL) 25 MG 24 hr tablet     . omeprazole (PRILOSEC) 40 MG capsule TAKE 1 CAPSULE (40 MG TOTAL) BY MOUTH DAILY. 90 capsule 3  . SAFETY-LOK 3CC SYR 22GX1.5" 22G X 1-1/2" 3 ML MISC TO BE USED TO DRAW TESTOSTERONE FOR INJECTION 6 each 0  . TRULICITY 1.5 MG/0.5ML SOPN INJECT 1.5 MG INTO THE SKIN ONCE A WEEK.     No current facility-administered medications for this visit.    Allergies  Allergen Reactions  . Lactose     Other reaction(s): GI Upset (intolerance)

## 2018-07-22 NOTE — Patient Instructions (Signed)
Thank you for coming in today. Will plan on rechecking in early August for diabetes.  Okay to be seen in person or via video.  If seen in video we will send in lab order.  Continue current medication. Follow-up with cardiology. Send me in cardiology date and times of your chest pain episodes with low blood pressures.  I will also send notes to cardiologist.

## 2018-07-22 NOTE — Progress Notes (Signed)
Pt blood sugar this AM is 174

## 2018-08-13 ENCOUNTER — Encounter: Payer: Self-pay | Admitting: Family Medicine

## 2018-08-14 MED ORDER — AMBULATORY NON FORMULARY MEDICATION: 0 refills | Status: DC

## 2018-08-19 ENCOUNTER — Encounter: Payer: Self-pay | Admitting: Family Medicine

## 2018-08-25 ENCOUNTER — Encounter: Payer: Self-pay | Admitting: Family Medicine

## 2018-08-26 NOTE — Telephone Encounter (Signed)
Will complete FMLA form for patient's wife to transport him to doctor's appointments etc.

## 2018-08-28 ENCOUNTER — Telehealth: Payer: Self-pay | Admitting: Neurology

## 2018-08-28 NOTE — Telephone Encounter (Signed)
Patient made aware forms complete, he would like to pick them up. At the front for pick up. Copy to scan and to Dr. Georgina Snell.

## 2018-09-17 ENCOUNTER — Other Ambulatory Visit: Payer: Self-pay | Admitting: Family Medicine

## 2018-09-17 DIAGNOSIS — E1165 Type 2 diabetes mellitus with hyperglycemia: Secondary | ICD-10-CM

## 2018-09-22 ENCOUNTER — Other Ambulatory Visit: Payer: Self-pay | Admitting: Family Medicine

## 2018-09-22 ENCOUNTER — Encounter: Payer: Self-pay | Admitting: Family Medicine

## 2018-09-22 DIAGNOSIS — E782 Mixed hyperlipidemia: Secondary | ICD-10-CM

## 2018-09-22 DIAGNOSIS — R748 Abnormal levels of other serum enzymes: Secondary | ICD-10-CM

## 2018-09-22 DIAGNOSIS — I10 Essential (primary) hypertension: Secondary | ICD-10-CM

## 2018-09-22 DIAGNOSIS — E1169 Type 2 diabetes mellitus with other specified complication: Secondary | ICD-10-CM

## 2018-09-22 DIAGNOSIS — R5382 Chronic fatigue, unspecified: Secondary | ICD-10-CM

## 2018-09-22 DIAGNOSIS — E1165 Type 2 diabetes mellitus with hyperglycemia: Secondary | ICD-10-CM

## 2018-10-22 ENCOUNTER — Ambulatory Visit: Payer: BC Managed Care – PPO | Admitting: Family Medicine

## 2018-10-22 NOTE — Progress Notes (Deleted)
Russell Ramos is a 43 y.o. male who [facility] today for follow-up diabetes, ventricular fibrillation, and mood.  Earlier this year patient suffered a witnessed by EMS cardiac arrest due to V. fib and was successfully resuscitated with no significant neurological impairment.  The fundamental underlying cause was unknown however patient had insertion of implanted defibrillator and has had subsequent normal device interrogations.  In the interim he has ***  Anxiety: Patient had an existing history of anxiety that obviously worsened with his near death experience.  He had been seen by a psychiatrist and when last checked in June his medication for anxiety was switched to Viibryd which she was tolerating much better.  Diabetes: A1c last checked in April at 7.5. ROS as above:  Exam:  There were no vitals taken for this visit. Wt Readings from Last 5 Encounters:  07/22/18 300 lb (136.1 kg)  06/30/18 (!) 308 lb (139.7 kg)  06/20/18 300 lb (136.1 kg)  06/16/18 (!) 306 lb (138.8 kg)  04/21/18 (!) 306 lb (138.8 kg)    Gen: Well NAD HEENT: EOMI,  MMM Lungs: Normal work of breathing. CTABL Heart: RRR no MRG Abd: NABS, Soft. Nondistended, Nontender Exts: Brisk capillary refill, warm and well perfused.   Lab and Radiology Results No results found for this or any previous visit (from the past 72 hour(s)). No results found.    Assessment and Plan: 43 y.o. male with ***  PDMP not reviewed this encounter. No orders of the defined types were placed in this encounter.  No orders of the defined types were placed in this encounter.    Historical information moved to improve visibility of documentation.  Past Medical History:  Diagnosis Date  . Anxiety   . Chronic left shoulder pain   . Diabetes mellitus without complication (HCC)   . HTN  (hypertension) 02/20/2013  . Hypertension   . PONV (postoperative nausea and vomiting)   . PTSD (post-traumatic stress disorder)   . Pulmonary embolism (HCC)   . Pulmonary hypertension (HCC)   . Sleep apnea    does not use CPAP   Past Surgical History:  Procedure Laterality Date  . KNEE SURGERY Left   . RADIOLOGY WITH ANESTHESIA Left 10/01/2017   Procedure: MRI OF LEFT SHOULDER WITH CONTRAST AND DG FLOURO GUIDE NEEDLE INJECTION;  Surgeon: Radiologist, Medication, MD;  Location: [facility] OR;  Service: Radiology;  Laterality: Left;   Social History   Tobacco Use  . Smoking status: Former Smoker    Packs/day: 1.00    Types: Cigarettes    Quit date: 09/12/2015    Years since quitting: 3.1  . Smokeless tobacco: Never Used  Substance Use Topics  . Alcohol use: Yes    Comment: socially   family history includes Diabetes in his mother.  Medications: Current Outpatient Medications  Medication Sig Dispense Refill  . AMBULATORY NON FORMULARY MEDICATION Single glucometer with lancets, test strips. Use glucometer of choice Test daily.  Uncontrolled Diabetes E11.9 1 each 0  . AMBULATORY NON FORMULARY MEDICATION Please provide appropriate CPAP supplies and services for sleep apnea.  Continue current CPAP settings. AHI 13.3 Fax to [facility] 1 each 0  . ASPIRIN LOW DOSE 81 MG EC tablet     . atorvastatin (LIPITOR) 80 MG tablet TAKE 1 TABLET BY MOUTH EVERY DAY 90 tablet 1  . cholecalciferol (VITAMIN D) 1000 units tablet Take 1,000 Units by mouth daily.    .Marland Kitchen  clonazePAM (KLONOPIN) 2 MG tablet Take 1 tablet by mouth 2 (two) times daily as needed. Usually once daily    . glucose blood test strip USE TO TEST BLOOD SUGAR ONCE DAILY 100 each 0  . JARDIANCE 25 MG TABS tablet TAKE 1 TABLET BY MOUTH EVERY DAY 90 tablet 3  . Lancets (ONETOUCH DELICA PLUS [id number]) MISC USE TO TEST BLOOD SUGAR ONCE DAILY 100 each 0  . lisinopril (PRINIVIL,ZESTRIL) 5 MG tablet TAKE 1 TABLET BY MOUTH EVERY DAY 90 tablet 1  .  metoprolol succinate (TOPROL-XL) 25 MG 24 hr tablet     . omeprazole (PRILOSEC) 40 MG capsule TAKE 1 CAPSULE (40 MG TOTAL) BY MOUTH DAILY. 90 capsule 3  . SAFETY-LOK 3CC SYR 22GX1.5" 22G X 1-1/2" 3 ML MISC TO BE USED TO DRAW 1ML TESTOSTERONE FOR INJECTION 6 each 0  . TRULICITY 1.5 ID/7.8EU SOPN INJECT 1.5 MG INTO THE SKIN ONCE A WEEK.    . Vilazodone HCl (VIIBRYD) 40 MG TABS Take by mouth daily.     No current facility-administered medications for this visit.    Allergies  Allergen Reactions  . Lactose     Other reaction(s): GI Upset (intolerance)     Discussed warning signs or symptoms. Please see discharge instructions. Patient expresses understanding.

## 2018-11-17 ENCOUNTER — Ambulatory Visit: Payer: BC Managed Care – PPO | Admitting: Family Medicine

## 2018-11-17 ENCOUNTER — Other Ambulatory Visit: Payer: Self-pay

## 2018-11-17 ENCOUNTER — Encounter: Payer: Self-pay | Admitting: Family Medicine

## 2018-11-17 VITALS — BP 110/78 | HR 98 | Temp 98.2°F | Wt 323.0 lb

## 2018-11-17 DIAGNOSIS — I10 Essential (primary) hypertension: Secondary | ICD-10-CM

## 2018-11-17 DIAGNOSIS — S43432D Superior glenoid labrum lesion of left shoulder, subsequent encounter: Secondary | ICD-10-CM

## 2018-11-17 DIAGNOSIS — E1169 Type 2 diabetes mellitus with other specified complication: Secondary | ICD-10-CM

## 2018-11-17 DIAGNOSIS — E782 Mixed hyperlipidemia: Secondary | ICD-10-CM

## 2018-11-17 DIAGNOSIS — E1165 Type 2 diabetes mellitus with hyperglycemia: Secondary | ICD-10-CM

## 2018-11-17 DIAGNOSIS — Z23 Encounter for immunization: Secondary | ICD-10-CM | POA: Diagnosis not present

## 2018-11-17 DIAGNOSIS — I152 Hypertension secondary to endocrine disorders: Secondary | ICD-10-CM

## 2018-11-17 DIAGNOSIS — E1159 Type 2 diabetes mellitus with other circulatory complications: Secondary | ICD-10-CM

## 2018-11-17 DIAGNOSIS — F410 Panic disorder [episodic paroxysmal anxiety] without agoraphobia: Secondary | ICD-10-CM

## 2018-11-17 DIAGNOSIS — M25512 Pain in left shoulder: Secondary | ICD-10-CM | POA: Diagnosis not present

## 2018-11-17 DIAGNOSIS — Z8674 Personal history of sudden cardiac arrest: Secondary | ICD-10-CM

## 2018-11-17 DIAGNOSIS — I272 Pulmonary hypertension, unspecified: Secondary | ICD-10-CM

## 2018-11-17 LAB — POCT GLYCOSYLATED HEMOGLOBIN (HGB A1C): Hemoglobin A1C: 7.1 % — AB (ref 4.0–5.6)

## 2018-11-17 NOTE — Patient Instructions (Signed)
Thank you for coming in today. Diabetes and blood pressue is good.  You should hear from Dr Tamera Punt soon.  Let me know if you do not hear anything.  Get your shoulder MRI on CD and bring it with you to the appointment.   Recheck in 6 months return sooner if needed.

## 2018-11-17 NOTE — Progress Notes (Signed)
Russell Ramos is a 43 y.o. male who presents to [facility] [employer] today for follow-up left shoulder pain, diabetes, heart disease, anxiety.  Left shoulder pain: Patient has ongoing left shoulder pain in 2019 he had treatment with conservative measures including injection and home exercise program.  Ultimately he had MRI arthrogram which showed superior labrum tear.  Need consultation with orthopedic surgery (Dr. Mardelle Matte) who recommended extended trial of conservative management. Fitzpatrick's health took a turn for the worse as described below in the spring 2020.  He notes that now that his health is overall better controlled his shoulder is still very bothersome.  He notes clicking and popping and pain.  He has difficulty with some home activities such as overhead reach.  Additionally sleeping is painful.  He is interested at this point in surgery if possible.  Heart disease: Patient has a history of pulmonary embolism due to DVT following the left knee surgery a few years ago.  In the spring 2020 he had a witnessed by EMS episode of ventricular fibrillation with unclear fundamental etiology.  He had extensive work-up with cardiology and has implanted ICD.  He notes he is no longer having palpitations or episodes of chest pain and feeling much better.  He takes medications as listed below.  Diabetes: Currently quite well controlled with Jardiance, Trulicity.  Blood sugar well controlled.  No polyuria polydipsia.  Happy with how things are going.  Anxiety: Managed with psychiatry with Depakote, and Viibryd.  Depakote recently started by psychiatry and has been very helpful for controlling panic attacks and anxiety episodes.  However has caused a little bit of weight gain.  Is happy with how things are going.    ROS as above:  Exam:  BP 110/78   Pulse 98   Temp 98.2 F (36.8 C) (Oral)    Wt (!) 323 lb (146.5 kg)   BMI 48.40 kg/m  Wt Readings from Last 5 Encounters:  11/17/18 (!) 323 lb (146.5 kg)  07/22/18 300 lb (136.1 kg)  06/30/18 (!) 308 lb (139.7 kg)  06/20/18 300 lb (136.1 kg)  06/16/18 (!) 306 lb (138.8 kg)    Gen: Well NAD HEENT: EOMI,  MMM Lungs: Normal work of breathing. CTABL Heart: RRR no MRG Abd: NABS, Soft. Nondistended, Nontender Exts: Brisk capillary refill, warm and well perfused.  Left shoulder: Normal-appearing nontender. Normal motion. Intact strength to abduction.  External rotation strength painful 4+/5.  Internal rotation strength normal. Negative Hawkins and Neer's test.  Positive empty can test.  Positive O'Brien's test.  Positive clunk test. Negative Yergason's and speeds test. Pulses cap refill sensation intact distally.  Lab and Radiology Results  Lab Results  Component Value Date   HGBA1C 7.1 (A) 11/17/2018    EXAM: MR ARTHROGRAM OF THE left SHOULDER  TECHNIQUE: Multiplanar, multisequence MR imaging of the left shoulder was performed following the administration of intra-articular contrast.  CONTRAST:  See Injection Documentation.  COMPARISON:  None.  FINDINGS: Rotator cuff: The rotator cuff tendons are intact. No partial or full-thickness tear. Mild tendinopathy.  Muscles: Normal  Biceps long head: Intact  Acromioclavicular Joint: Mild degenerative changes. The acromion is type 2 in shape. No lateral downsloping or undersurface spurring.  Glenohumeral Joint: No degenerative changes.  Labrum: Superior posterior labral tear. The anterior labrum is intact. The glenohumeral ligaments appear normal.  Bones: No acute bony findings.  IMPRESSION: 1. Intact rotator cuff tendons.  Minimal tendinopathy. 2.  Intact long head biceps tendon. 3. Superior posterior labral tear.  The anterior labrum is intact. 4. No significant findings for bony impingement.   Electronically Signed   By: Rudie MeyerP.  Ramos  M.D.   On: 10/01/2017 16:14  I personally (independently) visualized and performed the interpretation of the images attached in this note.    Assessment and Plan: 43 y.o. male with   Shoulder pain: Ongoing left shoulder pain failing conservative management.  Patient does have known labrum tear from MRI arthrogram left shoulder August 2019.  At this point I think is reasonable to have a consultation with orthopedic surgery again.  He understands that it is possible that he may not benefit from surgery but is definitely willing to consider it again as his current shoulder pain is affecting his quality of life.  [facility] to [facility].  Diabetes: Currently well controlled.  Plan to continue current regimen.  Recheck in 6 months to follow as well.  Heart disease history of ventricular fibrillation: Fundamental etiology of this remains somewhat unclear.  This is essentially managed with cardiology.  His blood pressure is well controlled and his lipids are optimized.  He did not have significant CAD on cardiac catheterization.  He does have implanted ICD which should help prevent further episodes.  Obviously the ICD limits his ability to have subsequent MRIs.  Watchful waiting recheck with cardiology per protocol.  Psych: Anxiety well managed with psychiatry.  His mild weight gain with Depakote is certainly acceptable per him as he is much happier to have better controlled anxiety.  Plan to continue current regimen.  Flu vaccine given today prior to discharge.  I informed patient that I am transitioning to sports medicine only [facility] in [place] starting in November.  Happy to see patient for continued sports medicine needs.  Discussed need for new PCP.  Provided some recommendations.   PDMP reviewed during this encounter. Orders Placed This Encounter  Procedures  . Flu Vaccine QUAD 6+ mos PF IM (Fluarix Quad PF)  . Ambulatory referral to  Orthopedic Surgery    Referral Priority:   Routine    Referral Type:   Surgical    Referral Reason:   Specialty Services Required    Referred to Provider:   Jones Broomhandler, Justin, MD    Requested Specialty:   Orthopedic Surgery    Number of Visits Requested:   1  . POCT HgB A1C   No orders of the defined types were placed in this encounter.    Historical information moved to improve visibility of documentation.  Past Medical History:  Diagnosis Date  . Anxiety   . Chronic left shoulder pain   . Diabetes mellitus without complication (HCC)   . History of sudden cardiac arrest successfully resuscitated 06/23/2018  . HTN (hypertension) 02/20/2013  . Hypertension   . PONV (postoperative nausea and vomiting)   . PTSD (post-traumatic stress disorder)   . Pulmonary embolism (HCC)   . Pulmonary hypertension (HCC)   . Sleep apnea    does not use CPAP  . Tear of left glenoid labrum 11/18/2018   MRI arthrogram August 2019   Past Surgical History:  Procedure Laterality Date  . KNEE SURGERY Left   . RADIOLOGY WITH ANESTHESIA Left 10/01/2017   Procedure: MRI OF LEFT SHOULDER WITH CONTRAST AND DG FLOURO GUIDE NEEDLE INJECTION;  Surgeon: Radiologist, Medication, MD;  Location: [facility] OR;  Service: Radiology;  Laterality: Left;   Social History  Tobacco Use  . Smoking status: Former Smoker    Packs/day: 1.00    Types: Cigarettes    Quit date: 09/12/2015    Years since quitting: 3.1  . Smokeless tobacco: Never Used  Substance Use Topics  . Alcohol use: Yes    Comment: socially   family history includes Diabetes in his mother.  Medications: Current Outpatient Medications  Medication Sig Dispense Refill  . AMBULATORY NON FORMULARY MEDICATION Single glucometer with lancets, test strips. Use glucometer of choice Test daily.  Uncontrolled Diabetes E11.9 1 each 0  . AMBULATORY NON FORMULARY MEDICATION Please provide appropriate CPAP supplies and services for sleep apnea.  Continue current CPAP  settings. AHI 13.3 Fax to [facility] 1 each 0  . ASPIRIN LOW DOSE 81 MG EC tablet     . atorvastatin (LIPITOR) 80 MG tablet TAKE 1 TABLET BY MOUTH EVERY DAY 90 tablet 1  . cholecalciferol (VITAMIN D) 1000 units tablet Take 1,000 Units by mouth daily.    . clonazePAM (KLONOPIN) 2 MG tablet Take 1 tablet by mouth 2 (two) times daily as needed. Usually once daily    . glucose blood test strip USE TO TEST BLOOD SUGAR ONCE DAILY 100 each 0  . JARDIANCE 25 MG TABS tablet TAKE 1 TABLET BY MOUTH EVERY DAY 90 tablet 3  . Lancets (ONETOUCH DELICA PLUS [id number]) MISC USE TO TEST BLOOD SUGAR ONCE DAILY 100 each 0  . lisinopril (PRINIVIL,ZESTRIL) 5 MG tablet TAKE 1 TABLET BY MOUTH EVERY DAY 90 tablet 1  . metoprolol succinate (TOPROL-XL) 25 MG 24 hr tablet     . omeprazole (PRILOSEC) 40 MG capsule TAKE 1 CAPSULE (40 MG TOTAL) BY MOUTH DAILY. 90 capsule 3  . SAFETY-LOK 3CC SYR 22GX1.5" 22G X 1-1/2" 3 ML MISC TO BE USED TO DRAW TESTOSTERONE FOR INJECTION 6 each 0  . TRULICITY 1.5 MG/0.5ML SOPN INJECT 1.5 MG INTO THE SKIN ONCE A WEEK.    . Vilazodone HCl (VIIBRYD) 40 MG TABS Take by mouth daily.    . divalproex (DEPAKOTE ER) 250 MG 24 hr tablet TAKE 1 TABLET BY MOUTH EVERYDAY AT BEDTIME    . hydrOXYzine (VISTARIL) 25 MG capsule TAKE 1 CAPSULE BY MOUTH EVERY 6 HOURS AS NEEDED FOR SEVERE ANXIETY/PANIC     No current facility-administered medications for this visit.    Allergies  Allergen Reactions  . Lactose     Other reaction(s): GI Upset (intolerance)     Discussed warning signs or symptoms. Please see discharge instructions. Patient expresses understanding.

## 2018-11-18 ENCOUNTER — Encounter: Payer: Self-pay | Admitting: Family Medicine

## 2018-11-18 DIAGNOSIS — S43432A Superior glenoid labrum lesion of left shoulder, initial encounter: Secondary | ICD-10-CM

## 2018-11-18 HISTORY — DX: Superior glenoid labrum lesion of left shoulder, initial encounter: S43.432A

## 2018-12-09 ENCOUNTER — Other Ambulatory Visit: Payer: Self-pay | Admitting: Orthopedic Surgery

## 2018-12-14 ENCOUNTER — Encounter: Payer: Self-pay | Admitting: Family Medicine

## 2018-12-15 MED ORDER — CYCLOBENZAPRINE HCL 10 MG PO TABS: 10.0000 mg | ORAL_TABLET | Freq: Three times a day (TID) | ORAL | 0 refills | Status: DC | PRN

## 2018-12-19 ENCOUNTER — Ambulatory Visit (HOSPITAL_BASED_OUTPATIENT_CLINIC_OR_DEPARTMENT_OTHER): Admit: 2018-12-19 | Payer: BC Managed Care – PPO | Admitting: Orthopedic Surgery

## 2018-12-19 ENCOUNTER — Encounter (HOSPITAL_BASED_OUTPATIENT_CLINIC_OR_DEPARTMENT_OTHER): Payer: Self-pay

## 2018-12-19 SURGERY — SHOULDER ARTHROSCOPY WITH BICEPS TENDON REPAIR
Anesthesia: Choice | Site: Shoulder | Laterality: Left

## 2019-02-02 ENCOUNTER — Inpatient Hospital Stay (HOSPITAL_COMMUNITY): Admission: RE | Admit: 2019-02-02 | Payer: BC Managed Care – PPO | Source: Ambulatory Visit

## 2019-02-02 NOTE — Progress Notes (Signed)
Pt was not tested today for pre-procedure covid testing per anesthesia guidelines. Pt stated having a positive result for covid on 12/24/18. Pt stated that he will have paper documentation of these positive results on the day of surgery.   Jacqlyn Larsen, RN

## 2019-02-03 ENCOUNTER — Encounter (HOSPITAL_BASED_OUTPATIENT_CLINIC_OR_DEPARTMENT_OTHER): Payer: Self-pay | Admitting: Orthopedic Surgery

## 2019-02-03 ENCOUNTER — Other Ambulatory Visit: Payer: Self-pay

## 2019-02-03 NOTE — Progress Notes (Addendum)
ADDENDUM:  Received device orders via fax from [place], placed in chart.  Have not received ekg or echo yet.  ADDENDUM:  Pt surgery has time change.  Called and spoke w/ pt to arrive at 1000 and npo after mn, no clear liquids after mn, pt verbalized understanding. Sent via fax a second request for pt's ekg tracing , echo results, and device orders  ADDENDUM:  Chart reviewed by anesthesia, Konrad Felix PA, stated ok to proceed (refer to progress note. Also, received pt's positive covid test documentation via fax from [facility] , dated 12-24-2018, placed in chart.    Spoke w/ via phone for pre-op interview--- PT  Lab needs dos----  Istat 8             Lab results--- EKG in care everywhere 08-19-2018  COVID test ------ pt does need covid test due to positive test 12-24-2018 per pt was done thru [facility] by [place].  Was given phone # [phone] as contact to get hard copy of results. Per pt had his wife had positive covid test 10/ 23/ 2020 . He and their children all had same symptoms, presumed covid.  Pt states his symptoms at that time was body aches, nausea, headache, SOB and DOE and symptoms resolved in 2 weeks.  Stated at time of covid test he did not have symptoms  Arrive at -------  1130 NPO after ------  MN w/ exception clear diet liquids until 0730 (no cream/ milk products) then nothing by mouth  Medications to take morning of surgery ----- Metoprolol, Prilosec, Viibyrd, Clonazepam w/ sips of water Diabetic medication ----- do not take jardiance morning of surgery  Patient Special Instructions ----- asked to bring cpap mask/ tubing dos  Pre-Op special Istructions -----  Requested ekg and echo from cardiology office.    Patient verbalized understanding of instructions that were given at this phone interview. Patient denies shortness of breath, chest pain, fever, cough a this phone interview.   Anesthesia Review:  Hx HTN, DM2, hx Sudden  cardiac arrest 06-23-2018 VF s/p cath and ICD insertion.  Pt denies any cardiac s&s. ICD device orders faxed today.  Cardiology clearance received via fax from dr chandler office, dated 12-02-2018, placed in chart.   PCP:  Dr Lynne Leader (lov in epic) Cardiologist :  Dr Shirlee More (lov 01-14-2019 in epic)  ICD device check:  10-29-2018 care everywhere  Chest x-ray : 08-19-2018 care everywhere EKG :  12-02-2018 care everywhere Echo :   12-04-2018 care everywhere Cardiac Cath :   06-23-2018 care everywhere Sleep Study/ CPAP :  Yes/ Yes Fasting Blood Sugar :  120-130   / Checks Blood Sugar -- times a day:   Per pt 3 to 4 times wkly in am Blood Thinner/ Instructions Maryjane Hurter Dose:  NO ASA / Instructions/ Last Dose :  ASA 81 mg/  Per pt has not stopped, was given any instructions from dr Tamera Punt office

## 2019-02-04 NOTE — Anesthesia Preprocedure Evaluation (Addendum)
Anesthesia Evaluation  Patient identified by MRN, date of birth, ID band Patient awake    Reviewed: Allergy & Precautions, NPO status , Patient's Chart, lab work & pertinent test results, reviewed documented beta blocker date and time   Airway Mallampati: II  TM Distance: >3 FB Neck ROM: Full    Dental no notable dental hx. (+) Teeth Intact   Pulmonary sleep apnea and Continuous Positive Airway Pressure Ventilation , former smoker,    Pulmonary exam normal breath sounds clear to auscultation       Cardiovascular hypertension, Pt. on medications and Pt. on home beta blockers Normal cardiovascular exam+ Cardiac Defibrillator  Rhythm:Regular Rate:Normal  Patient experienced sudden death 2018/06/25, witnessed cardiac arrest, successfully resuscitated.  AICD placed 06/24/2018   Neuro/Psych PSYCHIATRIC DISORDERS Anxiety negative neurological ROS     GI/Hepatic negative GI ROS, Neg liver ROS,   Endo/Other  diabetes, Well Controlled, Type 2, Oral Hypoglycemic AgentsMorbid obesityHyperlipidemia  Renal/GU negative Renal ROS  negative genitourinary   Musculoskeletal Torn glenoid labrum left shoulder   Abdominal (+) + obese,   Peds  Hematology negative hematology ROS (+)   Anesthesia Other Findings   Reproductive/Obstetrics                           Anesthesia Physical Anesthesia Plan  ASA: III  Anesthesia Plan: General   Post-op Pain Management:  Regional for Post-op pain   Induction: Intravenous  PONV Risk Score and Plan: 3 and Midazolam, Scopolamine patch - Pre-op, Dexamethasone, Ondansetron and Treatment may vary due to age or medical condition  Airway Management Planned: Oral ETT  Additional Equipment:   Intra-op Plan:   Post-operative Plan: Extubation in OR  Informed Consent: I have reviewed the patients History and Physical, chart, labs and discussed the procedure including the risks,  benefits and alternatives for the proposed anesthesia with the patient or authorized representative who has indicated his/her understanding and acceptance.     Dental advisory given  Plan Discussed with: CRNA and Surgeon  Anesthesia Plan Comments: (See PAT note 02/04/2019, Konrad Felix, PA-C)       Anesthesia Quick Evaluation

## 2019-02-04 NOTE — Progress Notes (Signed)
Anesthesia Chart Review   Case: [id number] Date/Time: 02/05/19 1322   Procedure: SHOULDER ARTHROSCOPY WITH BICEPS TENODESIS (Left Shoulder)   Anesthesia type: Choice   Pre-op diagnosis: LEFT SHOULDER SUPERIOR LABRAL TEAR FROM ATERIOR TO POSTERIOR   Location: [facility] OR ROOM 4. / [facility]   Surgeons: Jones Broom, MD      DISCUSSION:43 y.o. former smoker (25 pack years, quit 09/12/15) with h/o OSA on CPAP, DVT following knee surgery, HTN, DM II, PTSD, ICD in place with last check 10/29/2018 with no ICD events, left shoulder labral tear scheduled for above procedure 02/05/2019 with Dr. Jones Broom.   Positive COVID test 12/24/2018, hard copy of results on chart.  Pt will not be retested.   H/o witnessed cardiac arrest 06/2018 due to ventricular fibrillation.  ICD implanted 06/2018.  Coronary angiogram prior to ICD implant with mild non obstructive coronary disease.  Echo 11/2018 with LV EF 60-65%, ICD in right ventricle.  No sx since hospital discharge. Last seen by cardiologist 01/14/2019, stable at this visit with 3 month follow up recommended.  Pt cleared by cardiology, clearance on chart.    Anticipate pt can proceed with planned procedure barring acute status change and after evaluation DOS.  VS: Ht 5' 9.5" (1.765 m)   Wt (!) 142.9 kg   BMI 45.85 kg/m   PROVIDERS: Rodolph Bong, MD is PCP   Leron Croak, MD is Cardiologist  LABS: SDW (all labs ordered are listed, but only abnormal results are displayed)  Labs Reviewed - No data to display   IMAGES:   EKG: 07/04/2018 Rate 61 bpm Normal sinus rhythm   CV: Echo 11/2018 with LV EF 60-65%.  ICD in right ventricle.  Full results requested.   Echo 06/23/2018 Interpretation Summary A complete portable two-dimensional transthoracic echocardiogram with  color flow Doppler and Spectral Doppler was performed. The study was technically difficult. Definity contrast injection performed. The left ventricle  is grossly normal size. The left ventricular ejection fraction is moderately reduced (40-45%). There is a small pericardial effusion without echocardiographic evidence  of tamponade. There is normal left ventricular wall thickness. Unable to adequately determine diastolic dysfunction.  Cardiac Cath 06/23/2018 FINDINGS:  Coronary Angiography 1. Left Main -normal 2. Left anterior descending artery - normal 3. Diagonals - normal 4. Left Circumflex - normal 5. Obtuse Marginals - normal 6. Right Coronary Artery - 10% mid 7. Posterior Descending Artery - normal Past Medical History:  Diagnosis Date  . Chronic left shoulder pain   . GAD (generalized anxiety disorder)   . History of DVT in adulthood    2016 left lower extremity post op knee surgery,  completed xarelto treatment,  per pt none since and all test came back negative for blood disorder  . History of panic attacks   . History of pulmonary embolus (PE)    2016  w/ left lower extremity , post op knee surgery , completed xarelto treatment  . 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 Hypertension   . 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 OSA on CPAP    02-03-2019 per pt uses every night  . PTSD (post-traumatic stress disorder)   . Pulmonary hypertension (HCC)   . Tear of left glenoid labrum 11/18/2018   MRI arthrogram  August 2019  . Type 2 diabetes mellitus ([place])    followed by pcp--- (02-03-2019 per pt checks cbg 3 to 4 times weekly in AM,  Fasting cbg-- 120-130    Past Surgical History:  Procedure Laterality Date  . ANTERIOR CRUCIATE LIGAMENT REPAIR Left 2016  approx.    AND MCL REPAIR  . ARTHROSCOPY POSTERIOR LATERAL CORNER RECONSTRUCTION WITH ALLOGRAFT,  LEFT COMMON  PERONEAL NERVE NEUROLYSIS Left 07-14-2014    @[facility]   . CARDIAC CATHETERIZATION  06-23-2018   @[facility]    emergent for resusitated sudden cardiac arrest (VF) ;  showed non-obstructive CAD, ef 40-45%  . EP IMPLANTABLE DEVICE    . POSTERIOR LATERAL RECONSTRUCTION PERNEAL NERVE NEUROLYSIS  07/14/2014  . RADIOLOGY WITH ANESTHESIA Left 10/01/2017   Procedure: MRI OF LEFT SHOULDER WITH CONTRAST AND DG FLOURO GUIDE NEEDLE INJECTION;  Surgeon: Radiologist, Medication, MD;  Location: [facility];  Service: Radiology;  Laterality: Left;    MEDICATIONS: No current facility-administered medications for this encounter.   . ASPIRIN LOW DOSE 81 MG EC tablet  . atorvastatin (LIPITOR) 80 MG tablet  . cholecalciferol (VITAMIN D) 1000 units tablet  . clonazePAM (KLONOPIN) 2 MG tablet  . cyclobenzaprine (FLEXERIL) 10 MG tablet  . divalproex (DEPAKOTE ER) 250 MG 24 hr tablet  . hydrOXYzine (VISTARIL) 25 MG capsule  . JARDIANCE 25 MG TABS tablet  . lisinopril (PRINIVIL,ZESTRIL) 5 MG tablet  . metoprolol succinate (TOPROL-XL) 25 MG 24 hr tablet  . nitroGLYCERIN (NITROSTAT) 0.4 MG SL tablet  . omeprazole (PRILOSEC) 40 MG capsule  . TRULICITY 1.5 YT/0.3TW SOPN  . Vilazodone HCl (VIIBRYD) 40 MG TABS  . glucose blood test strip  . Lancets (ONETOUCH DELICA PLUS [id number]) MISC  . SAFETY-LOK 3CC SYR 22GX1.5" 22G X 1-1/2" 3 ML MISC   Maia Plan [facility] Pre-Surgical Testing [phone] 02/04/19  10:24 AM

## 2019-02-05 ENCOUNTER — Encounter (HOSPITAL_BASED_OUTPATIENT_CLINIC_OR_DEPARTMENT_OTHER): Payer: Self-pay | Admitting: Orthopedic Surgery

## 2019-02-05 ENCOUNTER — Ambulatory Visit (HOSPITAL_BASED_OUTPATIENT_CLINIC_OR_DEPARTMENT_OTHER): Payer: BC Managed Care – PPO | Admitting: Physician Assistant

## 2019-02-05 ENCOUNTER — Encounter (HOSPITAL_BASED_OUTPATIENT_CLINIC_OR_DEPARTMENT_OTHER)
Admission: RE | Disposition: A | Discharge status: Home / Self Care | Payer: Self-pay | Source: Home / Self Care | Attending: Orthopedic Surgery

## 2019-02-05 ENCOUNTER — Other Ambulatory Visit: Payer: Self-pay | Admitting: Orthopedic Surgery

## 2019-02-05 ENCOUNTER — Other Ambulatory Visit: Payer: Self-pay

## 2019-02-05 ENCOUNTER — Ambulatory Visit (HOSPITAL_BASED_OUTPATIENT_CLINIC_OR_DEPARTMENT_OTHER)
Admission: RE | Admit: 2019-02-05 | Discharge: 2019-02-05 | Disposition: A | Discharge status: Home / Self Care | Payer: BC Managed Care – PPO | Attending: Orthopedic Surgery | Admitting: Orthopedic Surgery

## 2019-02-05 DIAGNOSIS — S43432A Superior glenoid labrum lesion of left shoulder, initial encounter: Secondary | ICD-10-CM | POA: Diagnosis present

## 2019-02-05 DIAGNOSIS — Z86718 Personal history of other venous thrombosis and embolism: Secondary | ICD-10-CM | POA: Diagnosis not present

## 2019-02-05 DIAGNOSIS — Z8674 Personal history of sudden cardiac arrest: Secondary | ICD-10-CM | POA: Insufficient documentation

## 2019-02-05 DIAGNOSIS — Z79899 Other long term (current) drug therapy: Secondary | ICD-10-CM | POA: Insufficient documentation

## 2019-02-05 DIAGNOSIS — E119 Type 2 diabetes mellitus without complications: Secondary | ICD-10-CM | POA: Diagnosis not present

## 2019-02-05 DIAGNOSIS — I10 Essential (primary) hypertension: Secondary | ICD-10-CM | POA: Insufficient documentation

## 2019-02-05 DIAGNOSIS — X58XXXA Exposure to other specified factors, initial encounter: Secondary | ICD-10-CM | POA: Diagnosis not present

## 2019-02-05 DIAGNOSIS — Z86711 Personal history of pulmonary embolism: Secondary | ICD-10-CM | POA: Insufficient documentation

## 2019-02-05 DIAGNOSIS — Z7982 Long term (current) use of aspirin: Secondary | ICD-10-CM | POA: Insufficient documentation

## 2019-02-05 DIAGNOSIS — I2510 Atherosclerotic heart disease of native coronary artery without angina pectoris: Secondary | ICD-10-CM | POA: Diagnosis not present

## 2019-02-05 DIAGNOSIS — Z7984 Long term (current) use of oral hypoglycemic drugs: Secondary | ICD-10-CM | POA: Insufficient documentation

## 2019-02-05 DIAGNOSIS — Z95810 Presence of automatic (implantable) cardiac defibrillator: Secondary | ICD-10-CM | POA: Insufficient documentation

## 2019-02-05 DIAGNOSIS — Z833 Family history of diabetes mellitus: Secondary | ICD-10-CM | POA: Insufficient documentation

## 2019-02-05 DIAGNOSIS — Z87891 Personal history of nicotine dependence: Secondary | ICD-10-CM | POA: Diagnosis not present

## 2019-02-05 DIAGNOSIS — I272 Pulmonary hypertension, unspecified: Secondary | ICD-10-CM | POA: Diagnosis not present

## 2019-02-05 DIAGNOSIS — F411 Generalized anxiety disorder: Secondary | ICD-10-CM | POA: Insufficient documentation

## 2019-02-05 DIAGNOSIS — G4733 Obstructive sleep apnea (adult) (pediatric): Secondary | ICD-10-CM | POA: Diagnosis not present

## 2019-02-05 HISTORY — DX: Personal history of pulmonary embolism: Z86.711

## 2019-02-05 HISTORY — DX: Presence of automatic (implantable) cardiac defibrillator: Z95.810

## 2019-02-05 HISTORY — DX: Generalized anxiety disorder: F41.1

## 2019-02-05 HISTORY — DX: Type 2 diabetes mellitus without complications: E11.9

## 2019-02-05 HISTORY — DX: Personal history of other venous thrombosis and embolism: Z86.718

## 2019-02-05 HISTORY — DX: Personal history of other mental and behavioral disorders: Z86.59

## 2019-02-05 HISTORY — DX: Obstructive sleep apnea (adult) (pediatric): G47.33

## 2019-02-05 HISTORY — PX: SHOULDER ARTHROSCOPY WITH BICEPS TENDON REPAIR: SHX5674

## 2019-02-05 LAB — POCT I-STAT, CHEM 8
BUN: 16 mg/dL (ref 6–20)
Calcium, Ion: 1.24 mmol/L (ref 1.15–1.40)
Chloride: 101 mmol/L (ref 98–111)
Creatinine, Ser: 1 mg/dL (ref 0.61–1.24)
Glucose, Bld: 260 mg/dL — ABNORMAL HIGH (ref 70–99)
HCT: 47 % (ref 39.0–52.0)
Hemoglobin: 16 g/dL (ref 13.0–17.0)
Potassium: 3.8 mmol/L (ref 3.5–5.1)
Sodium: 139 mmol/L (ref 135–145)
TCO2: 26 mmol/L (ref 22–32)

## 2019-02-05 LAB — GLUCOSE, CAPILLARY
Glucose-Capillary: 202 mg/dL — ABNORMAL HIGH (ref 70–99)
Glucose-Capillary: 220 mg/dL — ABNORMAL HIGH (ref 70–99)

## 2019-02-05 SURGERY — SHOULDER ARTHROSCOPY WITH BICEPS TENDON REPAIR
Anesthesia: General | Site: Shoulder | Laterality: Left

## 2019-02-05 MED ORDER — FENTANYL CITRATE (PF) 100 MCG/2ML IJ SOLN
INTRAMUSCULAR | Status: AC
  Filled 2019-02-05: qty 2

## 2019-02-05 MED ORDER — ONDANSETRON HCL 4 MG/2ML IJ SOLN
INTRAMUSCULAR | Status: AC
  Filled 2019-02-05: qty 2

## 2019-02-05 MED ORDER — MIDAZOLAM HCL 5 MG/5ML IJ SOLN
INTRAMUSCULAR | Status: DC | PRN
  Administered 2019-02-05: 2 mg via INTRAVENOUS

## 2019-02-05 MED ORDER — PROPOFOL 10 MG/ML IV BOLUS
INTRAVENOUS | Status: AC
  Filled 2019-02-05: qty 20

## 2019-02-05 MED ORDER — BUPIVACAINE LIPOSOME 1.3 % IJ SUSP
INTRAMUSCULAR | Status: DC | PRN
  Administered 2019-02-05: 10 mL via PERINEURAL

## 2019-02-05 MED ORDER — INSULIN ASPART 100 UNIT/ML ~~LOC~~ SOLN
SUBCUTANEOUS | Status: AC
  Filled 2019-02-05: qty 1

## 2019-02-05 MED ORDER — INSULIN ASPART 100 UNIT/ML ~~LOC~~ SOLN
SUBCUTANEOUS | Status: DC | PRN
  Administered 2019-02-05: 8 [IU] via SUBCUTANEOUS

## 2019-02-05 MED ORDER — LIDOCAINE 2% (20 MG/ML) 5 ML SYRINGE
INTRAMUSCULAR | Status: DC | PRN
  Administered 2019-02-05: 100 mg via INTRAVENOUS

## 2019-02-05 MED ORDER — GLYCOPYRROLATE 0.2 MG/ML IJ SOLN
INTRAMUSCULAR | Status: DC | PRN
  Administered 2019-02-05: .1 mg via INTRAVENOUS

## 2019-02-05 MED ORDER — SUGAMMADEX SODIUM 500 MG/5ML IV SOLN
INTRAVENOUS | Status: DC | PRN
  Administered 2019-02-05: 500 mg via INTRAVENOUS

## 2019-02-05 MED ORDER — ROCURONIUM BROMIDE 10 MG/ML (PF) SYRINGE
PREFILLED_SYRINGE | INTRAVENOUS | Status: AC
  Filled 2019-02-05: qty 10

## 2019-02-05 MED ORDER — SUGAMMADEX SODIUM 500 MG/5ML IV SOLN
INTRAVENOUS | Status: AC
  Filled 2019-02-05: qty 5

## 2019-02-05 MED ORDER — ROCURONIUM BROMIDE 50 MG/5ML IV SOSY
PREFILLED_SYRINGE | INTRAVENOUS | Status: DC | PRN
  Administered 2019-02-05: 30 mg via INTRAVENOUS
  Administered 2019-02-05: 5 mg via INTRAVENOUS
  Administered 2019-02-05: 30 mg via INTRAVENOUS
  Administered 2019-02-05: 35 mg via INTRAVENOUS

## 2019-02-05 MED ORDER — DEXAMETHASONE SODIUM PHOSPHATE 10 MG/ML IJ SOLN
INTRAMUSCULAR | Status: AC
  Filled 2019-02-05: qty 1

## 2019-02-05 MED ORDER — OXYCODONE HCL 5 MG PO TABS
5.0000 mg | ORAL_TABLET | Freq: Once | ORAL | Status: DC | PRN
  Filled 2019-02-05: qty 1

## 2019-02-05 MED ORDER — PHENYLEPHRINE 40 MCG/ML (10ML) SYRINGE FOR IV PUSH (FOR BLOOD PRESSURE SUPPORT)
PREFILLED_SYRINGE | INTRAVENOUS | Status: DC | PRN
  Administered 2019-02-05: 120 ug via INTRAVENOUS

## 2019-02-05 MED ORDER — DEXTROSE 5 % IV SOLN
3.0000 g | INTRAVENOUS | Status: AC
  Administered 2019-02-05: 12:00:00 3 g via INTRAVENOUS
  Filled 2019-02-05: qty 3000

## 2019-02-05 MED ORDER — FENTANYL CITRATE (PF) 100 MCG/2ML IJ SOLN
25.0000 ug | INTRAMUSCULAR | Status: DC | PRN
  Administered 2019-02-05 (×2): 50 ug via INTRAVENOUS
  Administered 2019-02-05: 12:00:00 100 ug via INTRAVENOUS
  Filled 2019-02-05: qty 1

## 2019-02-05 MED ORDER — GLYCOPYRROLATE PF 0.2 MG/ML IJ SOSY
PREFILLED_SYRINGE | INTRAMUSCULAR | Status: AC
  Filled 2019-02-05: qty 1

## 2019-02-05 MED ORDER — ONDANSETRON HCL 4 MG/2ML IJ SOLN
4.0000 mg | Freq: Once | INTRAMUSCULAR | Status: DC | PRN
  Filled 2019-02-05: qty 2

## 2019-02-05 MED ORDER — LIDOCAINE 2% (20 MG/ML) 5 ML SYRINGE
INTRAMUSCULAR | Status: AC
  Filled 2019-02-05: qty 5

## 2019-02-05 MED ORDER — FENTANYL CITRATE (PF) 100 MCG/2ML IJ SOLN
25.0000 ug | INTRAMUSCULAR | Status: DC | PRN
  Filled 2019-02-05: qty 1

## 2019-02-05 MED ORDER — MEPERIDINE HCL 25 MG/ML IJ SOLN
6.2500 mg | INTRAMUSCULAR | Status: DC | PRN
  Filled 2019-02-05: qty 1

## 2019-02-05 MED ORDER — SUCCINYLCHOLINE CHLORIDE 200 MG/10ML IV SOSY
PREFILLED_SYRINGE | INTRAVENOUS | Status: DC | PRN
  Administered 2019-02-05: 160 mg via INTRAVENOUS

## 2019-02-05 MED ORDER — MIDAZOLAM HCL 2 MG/2ML IJ SOLN
INTRAMUSCULAR | Status: AC
  Filled 2019-02-05: qty 2

## 2019-02-05 MED ORDER — KETOROLAC TROMETHAMINE 30 MG/ML IJ SOLN
INTRAMUSCULAR | Status: AC
  Filled 2019-02-05: qty 1

## 2019-02-05 MED ORDER — CHLORHEXIDINE GLUCONATE 4 % EX LIQD
60.0000 mL | Freq: Once | CUTANEOUS | Status: DC
  Filled 2019-02-05: qty 118

## 2019-02-05 MED ORDER — ONDANSETRON HCL 4 MG/2ML IJ SOLN
INTRAMUSCULAR | Status: DC | PRN
  Administered 2019-02-05: 4 mg via INTRAVENOUS

## 2019-02-05 MED ORDER — MIDAZOLAM HCL 2 MG/2ML IJ SOLN
0.5000 mg | Freq: Once | INTRAMUSCULAR | Status: AC
  Administered 2019-02-05: 12:00:00 2 mg via INTRAVENOUS
  Filled 2019-02-05: qty 2

## 2019-02-05 MED ORDER — DEXAMETHASONE SODIUM PHOSPHATE 10 MG/ML IJ SOLN
INTRAMUSCULAR | Status: DC | PRN
  Administered 2019-02-05: 4 mg via INTRAVENOUS

## 2019-02-05 MED ORDER — PROPOFOL 10 MG/ML IV BOLUS
INTRAVENOUS | Status: DC | PRN
  Administered 2019-02-05: 100 mg via INTRAVENOUS
  Administered 2019-02-05: 300 mg via INTRAVENOUS

## 2019-02-05 MED ORDER — BUPIVACAINE HCL 0.5 % IJ SOLN
INTRAMUSCULAR | Status: DC | PRN
  Administered 2019-02-05: 20 mL

## 2019-02-05 MED ORDER — LACTATED RINGERS IV SOLN
INTRAVENOUS | Status: DC
  Filled 2019-02-05: qty 1000

## 2019-02-05 MED ORDER — CEFAZOLIN SODIUM-DEXTROSE 1-4 GM/50ML-% IV SOLN
INTRAVENOUS | Status: AC
  Filled 2019-02-05: qty 50

## 2019-02-05 MED ORDER — OXYCODONE-ACETAMINOPHEN 5-325 MG PO TABS: ORAL_TABLET | ORAL | 0 refills | Status: DC

## 2019-02-05 MED ORDER — INSULIN ASPART 100 UNIT/ML ~~LOC~~ SOLN
6.0000 [IU] | Freq: Once | SUBCUTANEOUS | Status: AC
  Administered 2019-02-05: 15:00:00 6 [IU] via SUBCUTANEOUS
  Filled 2019-02-05: qty 0.06

## 2019-02-05 MED ORDER — OXYCODONE HCL 5 MG/5ML PO SOLN
5.0000 mg | Freq: Once | ORAL | Status: DC | PRN
  Filled 2019-02-05: qty 5

## 2019-02-05 MED ORDER — CEFAZOLIN SODIUM-DEXTROSE 2-4 GM/100ML-% IV SOLN
INTRAVENOUS | Status: AC
  Filled 2019-02-05: qty 100

## 2019-02-05 SURGICAL SUPPLY — 60 items
ANCHOR SUT FBRTK 2.6 KNTLS (Anchor) ×2 IMPLANT
BNDG COHESIVE 4X5 TAN STRL (GAUZE/BANDAGES/DRESSINGS) IMPLANT
BURR OVAL 8 FLU 4.0X13 (MISCELLANEOUS) ×2 IMPLANT
CANNULA 5.75X7 CRYSTAL CLEAR (CANNULA) ×2 IMPLANT
CANNULA TWIST IN 8.25X7CM (CANNULA) IMPLANT
CHLORAPREP W/TINT 26 (MISCELLANEOUS) ×2 IMPLANT
COVER WAND RF STERILE (DRAPES) ×2 IMPLANT
CUTTER BONE 4.0MM X 13CM (MISCELLANEOUS) ×1 IMPLANT
DECANTER SPIKE VIAL GLASS SM (MISCELLANEOUS) IMPLANT
DRAPE INCISE IOBAN 66X45 STRL (DRAPES) ×2 IMPLANT
DRAPE LG THREE QUARTER DISP (DRAPES) ×1 IMPLANT
DRAPE ORTHO SPLIT 87X125 STRL (DRAPES) ×4 IMPLANT
DRAPE STERI 35X30 U-POUCH (DRAPES) ×2 IMPLANT
DRAPE SURG 17X23 STRL (DRAPES) ×2 IMPLANT
DRAPE U-SHAPE 47X51 STRL (DRAPES) ×2 IMPLANT
DRAPE U-SHAPE 76X120 STRL (DRAPES) ×4 IMPLANT
DRSG PAD ABDOMINAL 8X10 ST (GAUZE/BANDAGES/DRESSINGS) ×2 IMPLANT
ELECT REM PT RETURN 9FT ADLT (ELECTROSURGICAL) ×2
ELECTRODE REM PT RTRN 9FT ADLT (ELECTROSURGICAL) ×1 IMPLANT
GAUZE SPONGE 4X4 12PLY STRL (GAUZE/BANDAGES/DRESSINGS) ×2 IMPLANT
GAUZE XEROFORM 1X8 LF (GAUZE/BANDAGES/DRESSINGS) ×2 IMPLANT
GLOVE BIO SURGEON STRL SZ7 (GLOVE) ×2 IMPLANT
GLOVE BIO SURGEON STRL SZ7.5 (GLOVE) ×4 IMPLANT
GLOVE BIOGEL PI IND STRL 7.0 (GLOVE) ×1 IMPLANT
GLOVE BIOGEL PI IND STRL 8 (GLOVE) ×2 IMPLANT
GLOVE BIOGEL PI INDICATOR 7.0 (GLOVE) ×1
GLOVE BIOGEL PI INDICATOR 8 (GLOVE) ×2
GOWN STRL REUS W/ TWL LRG LVL3 (GOWN DISPOSABLE) ×2 IMPLANT
GOWN STRL REUS W/TWL LRG LVL3 (GOWN DISPOSABLE) ×2
MANIFOLD NEPTUNE II (INSTRUMENTS) ×2 IMPLANT
NDL MAYO 6 CRC TAPER PT (NEEDLE) IMPLANT
NDL MAYO CATGUT SZ4 TPR NDL (NEEDLE) IMPLANT
NDL SCORPION MULTI FIRE (NEEDLE) IMPLANT
NDL SUT 6 .5 CRC .975X.05 MAYO (NEEDLE) IMPLANT
NEEDLE MAYO 6 CRC TAPER PT (NEEDLE) IMPLANT
NEEDLE MAYO CATGUT SZ4 (NEEDLE) IMPLANT
NEEDLE MAYO TAPER (NEEDLE)
NEEDLE SCORPION MULTI FIRE (NEEDLE) IMPLANT
NS IRRIG 1000ML POUR BTL (IV SOLUTION) IMPLANT
PACK ARTHROSCOPY DSU (CUSTOM PROCEDURE TRAY) ×2 IMPLANT
PACK BASIN DAY SURGERY FS (CUSTOM PROCEDURE TRAY) ×2 IMPLANT
PAD ABD 8X10 STRL (GAUZE/BANDAGES/DRESSINGS) ×1 IMPLANT
PROBE BIPOLAR ATHRO 135MM 90D (MISCELLANEOUS) ×1 IMPLANT
RESTRAINT HEAD UNIVERSAL NS (MISCELLANEOUS) ×2 IMPLANT
SLEEVE SCD COMPRESS KNEE MED (MISCELLANEOUS) ×2 IMPLANT
SLING ARM FOAM STRAP LRG (SOFTGOODS) ×1 IMPLANT
SLING ARM IMMOBILIZER MED (SOFTGOODS) IMPLANT
SLING ARM MED ADULT FOAM STRAP (SOFTGOODS) IMPLANT
SLING ARM XL FOAM STRAP (SOFTGOODS) IMPLANT
SUPPORT WRAP ARM LG (MISCELLANEOUS) IMPLANT
SUT ETHILON 3 0 PS 1 (SUTURE) ×2 IMPLANT
SUT PDS AB 0 CT 36 (SUTURE) IMPLANT
SUT TIGER TAPE 7 IN WHITE (SUTURE) IMPLANT
SUTURE TAPE 1.3 40 TPR END (SUTURE) IMPLANT
SUTURETAPE 1.3 40 TPR END (SUTURE)
TAPE CLOTH SURG 6X10 WHT LF (GAUZE/BANDAGES/DRESSINGS) ×1 IMPLANT
TAPE FIBER 2MM 7IN #2 BLUE (SUTURE) IMPLANT
TOWEL OR 17X26 10 PK STRL BLUE (TOWEL DISPOSABLE) ×2 IMPLANT
TUBING ARTHROSCOPY IRRIG 16FT (MISCELLANEOUS) ×2 IMPLANT
TUBING SUCTION 1/4X6FT (MISCELLANEOUS) ×2 IMPLANT

## 2019-02-05 NOTE — Transfer of Care (Addendum)
Immediate Anesthesia Transfer of Care Note  Patient: Russell Ramos  Procedure(s) Performed: Procedure(s) (LRB): SHOULDER ARTHROSCOPY WITH BICEPS TENODESIS (Left)  Patient Location: PACU  Anesthesia Type: General  Level of Consciousness: drowsy   Airway & Oxygen Therapy: Patient Spontanous Breathing and Patient connected to face mask oxygen  Post-op Assessment: Report given to PACU RN and Post -op Vital signs reviewed and stable  Post vital signs: Reviewed and stable  Complications: No apparent anesthesia complications Last Vitals:  Vitals Value Taken Time  BP 136/78 02/05/19 1415  Temp    Pulse 74 02/05/19 1425  Resp 23 02/05/19 1425  SpO2 92 % 02/05/19 1425  Vitals shown include unvalidated device data.  Last Pain:  Vitals:   02/05/19 1415  TempSrc:   PainSc: 0-No pain

## 2019-02-05 NOTE — Anesthesia Postprocedure Evaluation (Signed)
Anesthesia Post Note  Patient: Russell Ramos  Procedure(s) Performed: SHOULDER ARTHROSCOPY WITH BICEPS TENODESIS (Left Shoulder)     Patient location during evaluation: PACU Anesthesia Type: General Level of consciousness: awake and alert and oriented Pain management: pain level controlled Vital Signs Assessment: post-procedure vital signs reviewed and stable Respiratory status: spontaneous breathing, nonlabored ventilation and respiratory function stable Cardiovascular status: blood pressure returned to baseline and stable Postop Assessment: no apparent nausea or vomiting Anesthetic complications: no    Last Vitals:  Vitals:   02/05/19 1530 02/05/19 1558  BP:    Pulse: 85 80  Resp: (!) 23 (!) 22  Temp:    SpO2: (!) 88% 96%    Last Pain:  Vitals:   02/05/19 1558  TempSrc:   PainSc: 0-No pain                 Clarann Helvey A.

## 2019-02-05 NOTE — OR Nursing (Signed)
1520-Russell Ramos rep from [employer] assessed pt AICD and states its working corredtly and printed a program which is on his chart.  Darin Engels rn

## 2019-02-05 NOTE — Anesthesia Procedure Notes (Signed)
Anesthesia Regional Block: Interscalene brachial plexus block   Pre-Anesthetic Checklist: ,, timeout performed, Correct Patient, Correct Site, Correct Laterality, Correct Procedure, Correct Position, site marked, Risks and benefits discussed,  Surgical consent,  Pre-op evaluation,  At surgeon's request and post-op pain management  Laterality: Left  Prep: chloraprep       Needles:  Injection technique: Single-shot  Needle Type: Echogenic Stimulator Needle     Needle Length: 9cm  Needle Gauge: 21   Needle insertion depth: 7 cm   Additional Needles:   Procedures:,,,, ultrasound used (permanent image in chart),,,,  Narrative:  Start time: 02/05/2019 11:36 AM End time: 02/05/2019 11:41 AM Injection made incrementally with aspirations every 5 mL.  Performed by: Personally  Anesthesiologist: Josephine Igo, MD  Additional Notes: Timeout performed. Patient sedated. Relevant anatomy ID'd using [place]. Incremental 2-53ml injection of LA with frequent aspiration. Patient tolerated procedure well.        Left Interscalene Block

## 2019-02-05 NOTE — H&P (Signed)
Russell Ramos is an 43 y.o. male.   Chief Complaint: L shoulder pain  HPI: H/o L shoulder SLAP tear failed conservative treatment. Indicated for arthroscopic management.  Past Medical History:  Diagnosis Date  . Chronic left shoulder pain   . GAD (generalized anxiety disorder)   . History of DVT in adulthood    2016 left lower extremity post op knee surgery,  completed xarelto treatment,  per pt none since and all test came back negative for blood disorder  . History of panic attacks   . History of pulmonary embolus (PE)    2016  w/ left lower extremity , post op knee surgery , completed xarelto treatment  . 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 Hypertension   . 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 OSA on CPAP    02-03-2019 per pt uses every night  . PTSD (post-traumatic stress disorder)   . Pulmonary hypertension (HCC)   . Tear of left glenoid labrum 11/18/2018   MRI arthrogram August 2019  . Type 2 diabetes mellitus (HCC)    followed by pcp--- (02-03-2019 per pt checks cbg 3 to 4 times weekly in AM,  Fasting cbg-- 120-130    Past Surgical History:  Procedure Laterality Date  . ANTERIOR CRUCIATE LIGAMENT REPAIR Left 2016  approx.    AND MCL REPAIR  . ARTHROSCOPY POSTERIOR LATERAL CORNER RECONSTRUCTION WITH ALLOGRAFT,  LEFT COMMON PERONEAL NERVE NEUROLYSIS Left 07-14-2014    @[facility]   . CARDIAC CATHETERIZATION  06-23-2018   @[facility]    emergent for resusitated sudden cardiac arrest (VF) ;  showed non-obstructive CAD, ef 40-45%  . EP IMPLANTABLE DEVICE    . POSTERIOR LATERAL RECONSTRUCTION PERNEAL NERVE NEUROLYSIS  07/14/2014  . RADIOLOGY WITH ANESTHESIA Left 10/01/2017   Procedure: MRI OF LEFT SHOULDER WITH CONTRAST AND DG  FLOURO GUIDE NEEDLE INJECTION;  Surgeon: Radiologist, Medication, MD;  Location: [facility];  Service: Radiology;  Laterality: Left;    Family History  Problem Relation Age of Onset  . Diabetes Mother    Social History:  reports that he quit smoking about 3 years ago. His smoking use included cigarettes. He has a 25.00 pack-year smoking history. He has never used smokeless tobacco. He reports current alcohol use. He reports current drug use. Drug: Marijuana.  Allergies:  Allergies  Allergen Reactions  . Lactose     Other reaction(s): GI Upset (intolerance)    Medications Prior to Admission  Medication Sig Dispense Refill  . ASPIRIN LOW DOSE 81 MG EC tablet Take 81 mg by mouth daily.     atorvastatin (LIPITOR) 80 MG tablet TAKE 1 TABLET BY MOUTH EVERY DAY (Patient taking differently: Take 80 mg by mouth at bedtime. ) 90 tablet 1  . cholecalciferol (VITAMIN D) 1000 units tablet Take 1,000 Units by mouth daily.    . clonazePAM (KLONOPIN) 2 MG tablet Take 1 tablet by mouth 2 (two) times daily.     . divalproex (DEPAKOTE ER) 250 MG 24 hr tablet Take 250 mg by mouth at bedtime.     07/16/2014 JARDIANCE 25 MG TABS tablet TAKE 1 TABLET BY MOUTH EVERY DAY (Patient taking differently: 25 mg daily. ) 90 tablet 3  . lisinopril (PRINIVIL,ZESTRIL) 5 MG tablet TAKE 1 TABLET BY MOUTH EVERY  DAY (Patient taking differently: Take 5 mg by mouth daily. ) 90 tablet 1  . metoprolol succinate (TOPROL-XL) 25 MG 24 hr tablet Take 25 mg by mouth daily.     Marland Kitchen omeprazole (PRILOSEC) 40 MG capsule TAKE 1 CAPSULE (40 MG TOTAL) BY MOUTH DAILY. (Patient taking differently: Take 40 mg by mouth daily. ) 90 capsule 3  . TRULICITY 1.5 LF/8.1OF SOPN once a week. Sunday's    . Vilazodone HCl (VIIBRYD) 40 MG TABS Take 40 mg by mouth daily.     . cyclobenzaprine (FLEXERIL) 10 MG tablet Take 1 tablet (10 mg total) by mouth 3 (three) times daily as needed for muscle spasms. 30 tablet 0  . glucose blood test strip USE TO TEST BLOOD SUGAR ONCE  DAILY 100 each 0  . hydrOXYzine (VISTARIL) 25 MG capsule Take 25 mg by mouth every 6 (six) hours as needed.     . Lancets (ONETOUCH DELICA PLUS [id number]) MISC USE TO TEST BLOOD SUGAR ONCE DAILY 100 each 0  . nitroGLYCERIN (NITROSTAT) 0.4 MG SL tablet Place 0.4 mg under the tongue every 5 (five) minutes as needed for chest pain.    Marland Kitchen SAFETY-LOK 3CC SYR 22GX1.5" 22G X 1-1/2" 3 ML MISC TO BE USED TO DRAW 1ML TESTOSTERONE FOR INJECTION 6 each 0    Results for orders placed or performed during the hospital encounter of 02/05/19 (from the past 48 hour(s))  I-STAT, chem 8     Status: Abnormal   Collection Time: 02/05/19 10:29 AM  Result Value Ref Range   Sodium 139 135 - 145 mmol/L   Potassium 3.8 3.5 - 5.1 mmol/L   Chloride 101 98 - 111 mmol/L   BUN 16 6 - 20 mg/dL   Creatinine, Ser 1.00 0.61 - 1.24 mg/dL   Glucose, Bld 260 (H) 70 - 99 mg/dL   Calcium, Ion 1.24 1.15 - 1.40 mmol/L   TCO2 26 22 - 32 mmol/L   Hemoglobin 16.0 13.0 - 17.0 g/dL   HCT 47.0 39.0 - 52.0 %   No results found.  Review of Systems  All other systems reviewed and are negative.   Blood pressure (!) 145/93, pulse 66, temperature 97.7 F (36.5 C), temperature source Oral, resp. rate 18, height 5' 9.5" (1.765 m), weight (!) 147.5 kg, SpO2 97 %. Physical Exam  Constitutional: He is oriented to person, place, and time. He appears well-developed and well-nourished.  HENT:  Head: Atraumatic.  Eyes: EOM are normal.  Cardiovascular: Intact distal pulses.  Respiratory: Effort normal.  Musculoskeletal:     Comments: L shoulder pain with obriens, hawkins test. NVID  Neurological: He is alert and oriented to person, place, and time.  Skin: Skin is warm and dry.  Psychiatric: He has a normal mood and affect.     Assessment/Plan L shoulder SLAP tear. Plan L arth biceps tenodesis. Risks / benefits of surgery discussed Consent on chart  NPO for OR Preop antibiotics   Isabella Stalling, MD 02/05/2019, 11:39 AM

## 2019-02-05 NOTE — Progress Notes (Signed)
AssistedDr. Foster with left, interscalene  block. Side rails up, monitors on throughout procedure. See vital signs in flow sheet. Tolerated Procedure well. ? ?

## 2019-02-05 NOTE — Op Note (Signed)
Procedure(s): SHOULDER ARTHROSCOPY WITH BICEPS TENODESIS Procedure Note  Russell Ramos male 43 y.o. 02/05/2019   Preoperative diagnosis: Left shoulder SLAP tear  Postoperative diagnosis: Same  Procedure(s) and Anesthesia Type:    LEFT SHOULDER ARTHROSCOPY WITH BICEPS TENODESIS - Choice  Surgeon(s) and Role:    Tania Ade, MD - Primary     Surgeon: Isabella Stalling   Assistants: Jeanmarie Hubert PA-C [facility] was present and scrubbed throughout the procedure and was essential in positioning, assisting with the camera and instrumentation,, and closure)  Anesthesia: General endotracheal anesthesia with preoperative interscalene block given by the attending anesthesiologist    Procedure Detail  SHOULDER ARTHROSCOPY WITH BICEPS TENODESIS  Estimated Blood Loss: Min         Drains: none  Blood Given: none         Specimens: none        Complications:  * No complications entered in OR log *         Disposition: PACU - hemodynamically stable.         Condition: stable    Procedure:   INDICATIONS FOR SURGERY: The patient is 43 y.o. male who has a long history of left shoulder pain with MRI documented SLAP tear, failed extensive conservative management.  Pain interferes with sleep and quality of life.  Indicated for surgical treatment to try and decrease pain and restore function.  OPERATIVE FINDINGS: Examination under anesthesia: No stiffness or instability  DESCRIPTION OF PROCEDURE: The patient was identified in preoperative  holding area where I personally marked the operative site after  verifying site, side, and procedure with the patient. An interscalene block was given by the attending anesthesiologist the holding area.  The patient was taken back to the operating room where general anesthesia was induced without complication and was placed in the beach-chair position with the back  elevated about 60 degrees and all extremities and head and neck  carefully padded and  positioned.   The left upper extremity was then prepped and  draped in a standard sterile fashion. The appropriate time-out  procedure was carried out. The patient did receive IV antibiotics  within 30 minutes of incision.   A small posterior portal incision was made and the arthroscope was introduced into the joint. An anterior portal was then established above the subscapularis using needle localization. Small cannula was placed anteriorly. Diagnostic arthroscopy was then carried out.  He was noted to have extensive complete tear of the superior labrum with the labrum hanging down into the joint.  The tear was from about 11:00 to 2:00.  There was complete detachment of the biceps origin.  This was debrided extensively with a shaver and then the proximal long head biceps tendon was tagged percutaneously with a PDS suture and released from the superior labrum.  Glenohumeral joint surfaces were intact without chondromalacia.  The supraspinatus and infraspinatus were intact with no evidence for partial tearing.  Subscapularis was also completely intact.  The arthroscope was then introduced into the subacromial space a standard lateral portal was established with needle localization.  The rotator cuff was examined and found to be intact.  No signs of bony impingement.  The camera was then moved to the lateral portal position and an accessory anterolateral portal was established for biceps tenodesis.  Biceps tendon and groove were palpated and opened to expose the tendon.  The tendon was then pulled into the subacromial space.  The biceps groove was then prepared and 2 anchors  were placed sequentially.  The first anchor was placed more distal in the groove with a fiber tack anchor.  The stitch was passed around the tendon and back into the anchor to secure it nicely into the distal groove.  A second anchor was placed in the same fashion.  The remainder of the tendon was cut and  discarded.  The tension was felt to be appropriate.  The arthroscopic equipment was removed from the joint and the portals were closed with 3-0 nylon in an interrupted fashion. Sterile dressings were then applied including Xeroform 4 x 4's ABDs and tape. The patient was then allowed to awaken from general anesthesia, placed in a sling, transferred to the stretcher and taken to the recovery room in stable condition.   POSTOPERATIVE PLAN: The patient will be discharged home today and will followup in one week for suture removal and wound check.  He will follow the standard tenodesis protocol.

## 2019-02-05 NOTE — Discharge Instructions (Signed)
Regional Anesthesia Blocks  1. Numbness or the inability to move the "blocked" extremity may last from 3-48 hours after placement. The length of time depends on the medication injected and your individual response to the medication. If the numbness is not going away after 48 hours, call your surgeon.  2. The extremity that is blocked will need to be protected until the numbness is gone and the  Strength has returned. Because you cannot feel it, you will need to take extra care to avoid injury. Because it may be weak, you may have difficulty moving it or using it. You may not know what position it is in without looking at it while the block is in effect.  3. For blocks in the legs and feet, returning to weight bearing and walking needs to be done carefully. You will need to wait until the numbness is entirely gone and the strength has returned. You should be able to move your leg and foot normally before you try and bear weight or walk. You will need someone to be with you when you first try to ensure you do not fall and possibly risk injury.  4. Bruising and tenderness at the needle site are common side effects and will resolve in a few days.  5. Persistent numbness or new problems with movement should be communicated to the surgeon or the [place] [phone] [place] [phone]). Post Anesthesia Home Care Instructions  Activity: Get plenty of rest for the remainder of the day. A responsible adult should stay with you for 24 hours following the procedure.  For the next 24 hours, DO NOT: -Drive a car -Paediatric nurse -Drink alcoholic beverages -Take any medication unless instructed by your physician -Make any legal decisions or sign important papers.  Meals: Start with liquid foods such as gelatin or soup. Progress to regular foods as tolerated. Avoid greasy, spicy, heavy foods. If nausea and/or vomiting occur, drink only clear liquids until the nausea  and/or vomiting subsides. Call your physician if vomiting continues.  Special Instructions/Symptoms: Your throat may feel dry or sore from the anesthesia or the breathing tube placed in your throat during surgery. If this causes discomfort, gargle with warm salt water. The discomfort should disappear within 24 hours.  If you had a scopolamine patch placed behind your ear for the management of post- operative nausea and/or vomiting:  1. The medication in the patch is effective for 72 hours, after which it should be removed.  Wrap patch in a tissue and discard in the trash. Wash hands thoroughly with soap and water. 2. You may remove the patch earlier than 72 hours if you experience unpleasant side effects which may include dry mouth, dizziness or visual disturbances. 3. Avoid touching the patch. Wash your hands with soap and water after contact with the patch.   Information for Discharge Teaching: EXPAREL (bupivacaine liposome injectable suspension)   Your surgeon gave you EXPAREL(bupivacaine) in your surgical incision to help control your pain after surgery.   EXPAREL is a local anesthetic that provides pain relief by numbing the tissue around the surgical site.  EXPAREL is designed to release pain medication over time and can control pain for up to 72 hours.  Depending on how you respond to EXPAREL, you may require less pain medication during your recovery.  Possible side effects:  Temporary loss of sensation or ability to move in the area where bupivacaine was injected.  Nausea, vomiting, constipation  Rarely, numbness and  tingling in your mouth or lips, lightheadedness, or anxiety may occur.  Call your doctor right away if you think you may be experiencing any of these sensations, or if you have other questions regarding possible side effects.  Follow all other discharge instructions given to you by your surgeon or nurse. Eat a healthy diet and drink plenty of water or other  fluids.  If you return to the hospital for any reason within 96 hours following the administration of EXPAREL, please inform your health care providers.Discharge Instructions after Arthroscopic Shoulder Repair   A sling has been provided for you. Remain in your sling at all times. This includes sleeping in your sling.  Use ice on the shoulder intermittently over the first 48 hours after surgery.  Pain medicine has been prescribed for you.  Use your medicine liberally over the first 48 hours, and then you can begin to taper your use. You may take Extra Strength Tylenol or Tylenol only in place of the pain pills. DO NOT take ANY nonsteroidal anti-inflammatory pain medications: Advil, Motrin, Ibuprofen, Aleve, Naproxen, or Narprosyn.  You may remove your dressing after two days. If the incision sites are still moist, place a Band-Aid over the moist site(s). Change Band-Aids daily until dry.  You may shower 5 days after surgery. The incisions CANNOT get wet prior to 5 days. Simply allow the water to wash over the site and then pat dry. Do not rub the incisions. Make sure your axilla (armpit) is completely dry after showering.  Take one aspirin a day for 2 weeks after surgery, unless you have an aspirin sensitivity/ allergy or asthma.   Please call [phone] during normal business hours or [phone] after hours for any problems. Including the following:  - excessive redness of the incisions - drainage for more than 4 days - fever of more than 101.5 F  *Please note that pain medications will not be refilled after hours or on weekends.

## 2019-02-05 NOTE — Anesthesia Procedure Notes (Addendum)
Procedure Name: Intubation Date/Time: 02/05/2019 12:34 PM Performed by: Bonney Aid, CRNA Pre-anesthesia Checklist: Patient identified, Emergency Drugs available, Suction available and Patient being monitored Patient Re-evaluated:Patient Re-evaluated prior to induction Oxygen Delivery Method: Circle system utilized Preoxygenation: Pre-oxygenation with 100% oxygen Induction Type: IV induction Ventilation: Two handed mask ventilation required and Oral airway inserted - appropriate to patient size Grade View: Grade I Tube type: Oral Tube size: 7.5 mm Number of attempts: 1 Airway Equipment and Method: Oral airway,  Rigid stylet and Video-laryngoscopy Placement Confirmation: ETT inserted through vocal cords under direct vision,  positive ETCO2 and breath sounds checked- equal and bilateral Secured at: 23 (at lip) cm Tube secured with: Tape Dental Injury: Teeth and Oropharynx as per pre-operative assessment  Difficulty Due To: Difficulty was anticipated, Difficult Airway- due to large tongue, Difficult Airway- due to anterior larynx and Difficult Airway- due to limited oral opening Future Recommendations: Recommend- induction with short-acting agent, and alternative techniques readily available Comments: Intubated with glidescope S4 blade by Dr. Royce Macadamia.

## 2019-03-15 ENCOUNTER — Other Ambulatory Visit: Payer: Self-pay | Admitting: Family Medicine

## 2019-03-17 ENCOUNTER — Other Ambulatory Visit: Payer: Self-pay | Admitting: Family Medicine

## 2019-03-17 DIAGNOSIS — R748 Abnormal levels of other serum enzymes: Secondary | ICD-10-CM

## 2019-03-17 DIAGNOSIS — I10 Essential (primary) hypertension: Secondary | ICD-10-CM

## 2019-03-17 DIAGNOSIS — E1165 Type 2 diabetes mellitus with hyperglycemia: Secondary | ICD-10-CM

## 2019-03-17 DIAGNOSIS — R5382 Chronic fatigue, unspecified: Secondary | ICD-10-CM

## 2019-03-17 DIAGNOSIS — E1169 Type 2 diabetes mellitus with other specified complication: Secondary | ICD-10-CM

## 2019-03-17 DIAGNOSIS — E782 Mixed hyperlipidemia: Secondary | ICD-10-CM

## 2019-06-10 ENCOUNTER — Other Ambulatory Visit: Payer: Self-pay | Admitting: Physician Assistant

## 2019-06-10 NOTE — Telephone Encounter (Signed)
Spoke to pt concerning establishing a new PCP since the departure of Dr. Denyse Amass. Pt states he has a new PCP established due to being unable to wait for Dr. Ashley Royalty although he loves our office. Advised patient to contact PCP for Trulicity refill.

## 2019-06-10 NOTE — Telephone Encounter (Signed)
Needs to establish care with new provider.

## 2019-06-16 ENCOUNTER — Other Ambulatory Visit: Payer: Self-pay | Admitting: Sports Medicine

## 2019-06-16 ENCOUNTER — Other Ambulatory Visit: Payer: Self-pay | Admitting: Family Medicine

## 2019-06-16 DIAGNOSIS — E1169 Type 2 diabetes mellitus with other specified complication: Secondary | ICD-10-CM

## 2019-06-16 DIAGNOSIS — E782 Mixed hyperlipidemia: Secondary | ICD-10-CM

## 2019-06-16 DIAGNOSIS — I10 Essential (primary) hypertension: Secondary | ICD-10-CM

## 2019-06-16 DIAGNOSIS — R5382 Chronic fatigue, unspecified: Secondary | ICD-10-CM

## 2019-06-16 DIAGNOSIS — E1165 Type 2 diabetes mellitus with hyperglycemia: Secondary | ICD-10-CM

## 2019-06-16 DIAGNOSIS — R748 Abnormal levels of other serum enzymes: Secondary | ICD-10-CM

## 2019-06-22 ENCOUNTER — Encounter: Payer: Self-pay | Admitting: Family Medicine

## 2019-06-26 ENCOUNTER — Other Ambulatory Visit: Payer: Self-pay

## 2019-06-26 ENCOUNTER — Encounter: Payer: Self-pay | Admitting: Family Medicine

## 2019-06-26 ENCOUNTER — Ambulatory Visit: Payer: BC Managed Care – PPO | Admitting: Family Medicine

## 2019-06-26 DIAGNOSIS — F410 Panic disorder [episodic paroxysmal anxiety] without agoraphobia: Secondary | ICD-10-CM

## 2019-06-26 DIAGNOSIS — I152 Hypertension secondary to endocrine disorders: Secondary | ICD-10-CM

## 2019-06-26 DIAGNOSIS — E1169 Type 2 diabetes mellitus with other specified complication: Secondary | ICD-10-CM | POA: Diagnosis not present

## 2019-06-26 DIAGNOSIS — R748 Abnormal levels of other serum enzymes: Secondary | ICD-10-CM | POA: Diagnosis not present

## 2019-06-26 DIAGNOSIS — E782 Mixed hyperlipidemia: Secondary | ICD-10-CM

## 2019-06-26 DIAGNOSIS — I10 Essential (primary) hypertension: Secondary | ICD-10-CM

## 2019-06-26 DIAGNOSIS — E1159 Type 2 diabetes mellitus with other circulatory complications: Secondary | ICD-10-CM

## 2019-06-26 DIAGNOSIS — E1165 Type 2 diabetes mellitus with hyperglycemia: Secondary | ICD-10-CM

## 2019-06-26 DIAGNOSIS — Z8674 Personal history of sudden cardiac arrest: Secondary | ICD-10-CM

## 2019-06-26 DIAGNOSIS — R5382 Chronic fatigue, unspecified: Secondary | ICD-10-CM

## 2019-06-26 DIAGNOSIS — E118 Type 2 diabetes mellitus with unspecified complications: Secondary | ICD-10-CM

## 2019-06-26 MED ORDER — TRULICITY 1.5 MG/0.5ML ~~LOC~~ SOAJ: SUBCUTANEOUS | 3 refills | Status: DC

## 2019-06-26 MED ORDER — OMEPRAZOLE 40 MG PO CPDR: 40.0000 mg | DELAYED_RELEASE_CAPSULE | Freq: Every day | ORAL | 3 refills | Status: DC

## 2019-06-26 MED ORDER — JARDIANCE 25 MG PO TABS: 25.0000 mg | ORAL_TABLET | Freq: Every day | ORAL | 3 refills | Status: DC

## 2019-06-26 MED ORDER — ATORVASTATIN CALCIUM 80 MG PO TABS: 80.0000 mg | ORAL_TABLET | Freq: Every day | ORAL | 3 refills | Status: DC

## 2019-06-26 NOTE — Assessment & Plan Note (Signed)
Extensive work up did not reveal any specific cause.  Has ICD, no recent symptoms.

## 2019-06-26 NOTE — Assessment & Plan Note (Signed)
Tolerating atorvastatin well.  Continue at current dose.

## 2019-06-26 NOTE — Assessment & Plan Note (Signed)
Blood sugars have been fairly well controlled, I imagine he should have improved control with his weight loss efforts.  Recent labs requested from [facility] center.  He will continue trulicity and jardiance for now.

## 2019-06-26 NOTE — Assessment & Plan Note (Signed)
Blood pressure is at goal at for age and co-morbidities.  I recommend continuation of current medications.  In addition they were instructed to follow a low sodium diet with regular exercise to help to maintain adequate control of blood pressure.  ? ?

## 2019-06-26 NOTE — Assessment & Plan Note (Signed)
Anxiety is better controlled at this time. He will continue to follow with psychiatry for management of medications.

## 2019-06-26 NOTE — Patient Instructions (Addendum)
Great to meet you today! Continue current medications.  We'll be in touch with lab results once we get those records over.  Congrats on weight loss, keep up the good work! See me again in about 3 months.

## 2019-06-26 NOTE — Progress Notes (Signed)
Russell Ramos - 44 y.o. male MRN [id number]  Date of birth: 1975/09/19  Subjective Chief Complaint  Patient presents with  . Establish Care    HPI Russell Ramos is a 44 y.o. male with history of T2DM, depression with anxiety, HLD, and history of witness cardiac arrest with resuscitation and ICD placement.   -HTN: Management with lisinopril and metoprolol.  He is tolerating this well.  He denies side effects including symptoms of hypotension.    -T2DM: Current management with trulicity and jardiance.  He is doing well with these.  Weight is down 30 lbs since last visit.  He has been exercising and changed his eating habits.  He is happy with his progress.  He did see another provider at [facility] and had full set of labs there a couple of weeks ago.  He states that he was never called with results so is not sure what his a1c is.  He denies symptoms related to his diabetes at this time.   -Depression and anxiety:  Managed by psychiatry. Current treatment with vilazdone, depakote and clonazepam. Anxiety is much better controlled.  He would like to get off these medications in the future, but doesn't feel like it is quite time yet.   -Cardiac arrest:  History of witnessed cardiac arrest with resucitation in 06/2018.  He had ICD placed and has done well since that time.  He continues on metoprolol and denies chest pain, shortness of breath, palpitations.  Denies ICD discharge since placement.   ROS:  A comprehensive ROS was completed and negative except as noted per HPI  Allergies  Allergen Reactions  . Lactose     Other reaction(s): GI Upset (intolerance)    Past Medical History:  Diagnosis Date  . Chronic left shoulder pain   . GAD (generalized anxiety disorder)   . History of DVT in adulthood    2016 left lower extremity post op knee surgery,  completed xarelto treatment,  per pt none since and all test came back negative for blood disorder  . History of panic attacks   .  History of pulmonary embolus (PE)    2016  w/ left lower extremity , post op knee surgery , completed xarelto treatment  . 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 Hypertension   . 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 OSA on CPAP    02-03-2019 per pt uses every night  . PTSD (post-traumatic stress disorder)   . Pulmonary hypertension (HCC)   . Tear of left glenoid labrum 11/18/2018   MRI arthrogram August 2019  . Type 2 diabetes mellitus (HCC)    followed by pcp--- (02-03-2019 per pt checks cbg 3 to 4 times weekly in AM,  Fasting cbg-- 120-130    Past Surgical History:  Procedure Laterality Date  . ANTERIOR CRUCIATE LIGAMENT REPAIR Left 2016  approx.    AND MCL REPAIR  . ARTHROSCOPY POSTERIOR LATERAL CORNER RECONSTRUCTION WITH ALLOGRAFT,  LEFT COMMON PERONEAL NERVE NEUROLYSIS Left 07-14-2014    @[facility]   . CARDIAC CATHETERIZATION  06-23-2018   @[facility]    emergent for resusitated sudden cardiac arrest (VF) ;  showed non-obstructive CAD, ef 40-45%  . EP IMPLANTABLE DEVICE    . POSTERIOR LATERAL RECONSTRUCTION PERNEAL NERVE NEUROLYSIS  07/14/2014  .  RADIOLOGY WITH ANESTHESIA Left 10/01/2017   Procedure: MRI OF LEFT SHOULDER WITH CONTRAST AND DG FLOURO GUIDE NEEDLE INJECTION;  Surgeon: Radiologist, Medication, MD;  Location: [place];  Service: Radiology;  Laterality: Left;  . SHOULDER ARTHROSCOPY WITH BICEPS TENDON REPAIR Left 02/05/2019   Procedure: SHOULDER ARTHROSCOPY WITH BICEPS TENODESIS;  Surgeon: Tania Ade, MD;  Location: [place];  Service: Orthopedics;  Laterality: Left;    Social History   Socioeconomic History  . Marital status: Married    Spouse name: Not on file  . Number of children: Not on file  .  Years of education: Not on file  . Highest education level: Not on file  Occupational History  . Not on file  Tobacco Use  . Smoking status: Former Smoker    Packs/day: 1.00    Years: 25.00    Pack years: 25.00    Types: Cigarettes    Quit date: 09/12/2015    Years since quitting: 3.7  . Smokeless tobacco: Never Used  Substance and Sexual Activity  . Alcohol use: Yes    Comment: occasional  . Drug use: Yes    Types: Marijuana    Comment: 02-03-2019  last used today  . Sexual activity: Yes    Partners: Female  Other Topics Concern  . Not on file  Social History Narrative   Pt is has been married for 20 years. He lives at home with his wife and son.         Epworth Sleepiness Scale Score: 7      --I have been told that I stop breathing during sleep    --I have diabetes   --I have been told that I snore   --I am overweight or am gaining weight   --I awake feeling not rested   Social Determinants of Health   Financial Resource Strain:   . Difficulty of Paying Living Expenses:   Food Insecurity:   . Worried About Charity fundraiser in the Last Year:   . Arboriculturist in the Last Year:   Transportation Needs:   . Film/video editor (Medical):   Marland Kitchen Lack of Transportation (Non-Medical):   Physical Activity:   . Days of Exercise per Week:   . Minutes of Exercise per Session:   Stress:   . Feeling of Stress :   Social Connections:   . Frequency of Communication with Friends and Family:   . Frequency of Social Gatherings with Friends and Family:   . Attends Religious Services:   . Active Member of Clubs or Organizations:   . Attends Archivist Meetings:   Marland Kitchen Marital Status:     Family History  Problem Relation Age of Onset  . Diabetes Mother     Health Maintenance  Topic Date Due  . OPHTHALMOLOGY EXAM  Never done  . HEMOGLOBIN A1C  05/17/2019  . FOOT EXAM  06/25/2020 (Originally 10/08/2018)  . INFLUENZA VACCINE  09/20/2019  . TETANUS/TDAP   09/15/2025  . PNEUMOCOCCAL POLYSACCHARIDE VACCINE AGE 53-64 HIGH RISK  Completed  . COVID-19 Vaccine  Completed  . HIV Screening  Completed     ----------------------------------------------------------------------------------------------------------------------------------------------------------------------------------------------------------------- Physical Exam BP 129/70 (BP Location: Left Arm, Patient Position: Sitting, Cuff Size: Large)   Pulse 78   Ht 5' 9.69" (1.77 m)   Wt 293 lb 8 oz (133.1 kg)   SpO2 96%   BMI 42.49 kg/m   Physical Exam Constitutional:      Appearance: Normal appearance.  HENT:     Head: Normocephalic and atraumatic.  Eyes:     General: No scleral icterus. Cardiovascular:     Rate and Rhythm: Normal rate and regular rhythm.  Pulmonary:     Effort: Pulmonary effort is normal.     Breath sounds: Normal breath sounds.  Musculoskeletal:     Cervical back: Neck supple.  Skin:    General: Skin is warm and dry.  Neurological:     General: No focal deficit present.     Mental Status: He is alert.  Psychiatric:        Mood and Affect: Mood normal.        Behavior: Behavior normal.     ------------------------------------------------------------------------------------------------------------------------------------------------------------------------------------------------------------------- Assessment and Plan  Type 2 diabetes mellitus with unspecified complications (HCC) Blood sugars have been fairly well controlled, I imagine he should have improved control with his weight loss efforts.  Recent labs requested from [facility] center.  He will continue trulicity and jardiance for now.  Mixed diabetic hyperlipidemia associated with type 2 diabetes mellitus (HCC) Tolerating atorvastatin well.  Continue at current dose.    History of sudden cardiac arrest successfully resuscitated Extensive work up did not reveal any specific cause.  Has  ICD, no recent symptoms.   Hypertension associated with diabetes (HCC) Blood pressure is at goal at for age and co-morbidities.  I recommend continuation of current medications.  In addition they were instructed to follow a low sodium diet with regular exercise to help to maintain adequate control of blood pressure.    Panic disorder Anxiety is better controlled at this time. He will continue to follow with psychiatry for management of medications.    Meds ordered this encounter  Medications  . atorvastatin (LIPITOR) 80 MG tablet    Sig: Take 1 tablet (80 mg total) by mouth daily.    Dispense:  90 tablet    Refill:  3  . empagliflozin (JARDIANCE) 25 MG TABS tablet    Sig: Take 25 mg by mouth daily.    Dispense:  90 tablet    Refill:  3  . omeprazole (PRILOSEC) 40 MG capsule    Sig: Take 1 capsule (40 mg total) by mouth daily.    Dispense:  90 capsule    Refill:  3  . Dulaglutide (TRULICITY) 1.5 MG/0.5ML SOPN    Sig: INJECT 1.5 MG INTO THE SKIN ONCE A WEEK.    Dispense:  6 pen    Refill:  3    Return in about 3 months (around 09/26/2019) for DM.    This visit occurred during the SARS-CoV-2 public health emergency.  Safety protocols were in place, including screening questions prior to the visit, additional usage of staff PPE, and extensive cleaning of exam room while observing appropriate contact time as indicated for disinfecting solutions.

## 2019-07-06 ENCOUNTER — Telehealth: Payer: Self-pay

## 2019-07-06 NOTE — Telephone Encounter (Signed)
Pt called stating knee pain has progressed.   He would like to schedule an MRI for further evaluation.

## 2019-07-07 NOTE — Telephone Encounter (Signed)
Recommend obtaining xray prior to MRI.

## 2019-07-08 ENCOUNTER — Other Ambulatory Visit: Payer: Self-pay | Admitting: Family Medicine

## 2019-07-08 DIAGNOSIS — G8929 Other chronic pain: Secondary | ICD-10-CM

## 2019-07-08 DIAGNOSIS — M25562 Pain in left knee: Secondary | ICD-10-CM

## 2019-07-08 NOTE — Telephone Encounter (Signed)
Advised patient that MRI would be contacting him for scheduling.   Patient understands that X-Ray was needed to substantiate a need for MRI.

## 2019-07-08 NOTE — Telephone Encounter (Signed)
Patient called about imaging. Wanted to let Dr Ashley Royalty know that he got X-ray done at [facility] last night "and it was negative and unnecessary, like I already told you it would be". Patient upset stating "I am calling you all every other day".  I advised him I would let Dr Ashley Royalty know that x-ray was done and that it would be reviewed by Dr Ashley Royalty then determine if MRI is appropriate. Patient was very upset. I advised him that we would call him with update.

## 2019-07-08 NOTE — Telephone Encounter (Signed)
Advised patient that Herby Abraham would be required for insurance prior to an MRI for payment.   Patient stated, "That's how they get you. They just drain you. It's just wasting your time and mine. I guess I'll just try not walking on my leg for a while".  Then he hung up the phone.

## 2019-07-08 NOTE — Telephone Encounter (Signed)
MRI orders enter.  Had to double check on his ICD to be sure this is compatible with MRI, it appears it should be ok to proceed.  Will need to obtain authorization through insurance and he will be contacted to schedule.

## 2019-07-16 ENCOUNTER — Other Ambulatory Visit (HOSPITAL_COMMUNITY): Payer: Self-pay | Admitting: Student

## 2019-07-16 ENCOUNTER — Other Ambulatory Visit: Payer: Self-pay

## 2019-07-16 ENCOUNTER — Ambulatory Visit (HOSPITAL_COMMUNITY)
Admission: RE | Admit: 2019-07-16 | Discharge: 2019-07-16 | Disposition: A | Discharge status: Home / Self Care | Payer: BC Managed Care – PPO | Source: Ambulatory Visit | Attending: Student | Admitting: Student

## 2019-07-16 ENCOUNTER — Ambulatory Visit (HOSPITAL_COMMUNITY)
Admission: RE | Admit: 2019-07-16 | Discharge: 2019-07-16 | Disposition: A | Discharge status: Home / Self Care | Payer: BC Managed Care – PPO | Source: Ambulatory Visit | Attending: Family Medicine | Admitting: Family Medicine

## 2019-07-16 DIAGNOSIS — Z950 Presence of cardiac pacemaker: Secondary | ICD-10-CM

## 2019-07-16 DIAGNOSIS — M25562 Pain in left knee: Secondary | ICD-10-CM | POA: Insufficient documentation

## 2019-07-16 DIAGNOSIS — G8929 Other chronic pain: Secondary | ICD-10-CM | POA: Diagnosis present

## 2019-07-16 NOTE — Progress Notes (Signed)
Informed of MRI for today.   Device system confirmed to be MRI conditional, with implant date > 6 weeks ago and no evidence of abandoned or epicardial leads in review of most recent CXR (CXR available via PACS; performed during an epic downtime.   Pt has Biotronik device and put in Auto Detect/MRI mode. No additional changes needed.   Tachy-therapies to off if applicable.   Program device back to pre-MRI settings after completion of exam.  Russell Ramos  07/16/2019 2:23 PM

## 2019-07-28 ENCOUNTER — Encounter: Payer: Self-pay | Admitting: Sports Medicine

## 2019-07-28 ENCOUNTER — Ambulatory Visit (INDEPENDENT_AMBULATORY_CARE_PROVIDER_SITE_OTHER): Payer: BC Managed Care – PPO | Admitting: Sports Medicine

## 2019-07-28 DIAGNOSIS — S8992XD Unspecified injury of left lower leg, subsequent encounter: Secondary | ICD-10-CM

## 2019-07-28 NOTE — Progress Notes (Signed)
    Procedures performed today:    Procedure: Real-time Ultrasound Guided  aspiration/injection of left knee Device: Samsung [id number]  Verbal informed consent obtained.  Time-out conducted.  Noted no overlying erythema, induration, or other signs of local infection.  Skin prepped in a sterile fashion.  Local anesthesia: Topical Ethyl chloride.  With sterile technique and under real time ultrasound guidance:  Using 18-gauge needle aspirated 11 cc of clear, straw-colored fluid, syringe switched and 1 cc Kenalog 40, 2 cc lidocaine, 2 cc bupivacaine injected easily Completed without difficulty  Pain immediately resolved suggesting accurate placement of the medication.  Advised to call if fevers/chills, erythema, induration, drainage, or persistent bleeding.  Images permanently stored and available for review in the ultrasound unit.  Impression: Technically successful ultrasound guided injection.  Independent interpretation of notes and tests performed by another provider:   On my personal review I do see some T2 signal going through his lateral meniscus as well, in fact pain is predominantly at the anterolateral joint line. He also had some increased T2 edema medially consistent with bone contusions.  Brief History, Exam, Impression, and Recommendations:    Posterolateral complex injury This is a pleasant 44 year old male, approximately 5 years ago he had a posterior lateral corner injury, with operative repair of his LCL. More recently he was bowling, approximately 2 to 3 months ago, twisted and felt pain in his left knee, he had some swelling, he saw Dr. Ashley Royalty, MRI was obtained that ultimately showed medial meniscal extrusion, joint effusion, chondral irregularities. On my personal review I do see some T2 signal going through his lateral meniscus as well, in fact pain is predominantly at the anterolateral joint line. He also had some increased T2 edema medially consistent with bone  contusions. Due to all of the above findings we aspirated and injected his knee today, he has a questionable history of gout so I am going to send his fluid for crystal analysis. Meniscal rehabilitation exercises given, return to see me in 1 month.    ___________________________________________ [name]. Benjamin Stain, M.D., ABFM., CAQSM. Primary Care and Sports Medicine [facility]  Adjunct Instructor of Family Medicine  [employer]

## 2019-07-28 NOTE — Assessment & Plan Note (Addendum)
This is a pleasant 44 year old male, approximately 5 years ago he had a posterior lateral corner injury, with operative repair of his LCL. More recently he was bowling, approximately 2 to 3 months ago, twisted and felt pain in his left knee, he had some swelling, he saw Dr. Ashley Royalty, MRI was obtained that ultimately showed medial meniscal extrusion, joint effusion, chondral irregularities. On my personal review I do see some T2 signal going through his lateral meniscus as well, in fact pain is predominantly at the anterolateral joint line. He also had some increased T2 edema medially consistent with bone contusions. Due to all of the above findings we aspirated and injected his knee today, he has a questionable history of gout so I am going to send his fluid for crystal analysis. Meniscal rehabilitation exercises given, return to see me in 1 month.

## 2019-07-29 LAB — SYNOVIAL FLUID, CRYSTAL

## 2019-08-03 ENCOUNTER — Ambulatory Visit (HOSPITAL_COMMUNITY): Payer: BC Managed Care – PPO

## 2019-08-06 ENCOUNTER — Other Ambulatory Visit (HOSPITAL_COMMUNITY): Payer: BC Managed Care – PPO

## 2019-08-12 ENCOUNTER — Ambulatory Visit (INDEPENDENT_AMBULATORY_CARE_PROVIDER_SITE_OTHER): Payer: BC Managed Care – PPO | Admitting: Sports Medicine

## 2019-08-12 ENCOUNTER — Encounter: Payer: Self-pay | Admitting: Sports Medicine

## 2019-08-12 DIAGNOSIS — S8992XD Unspecified injury of left lower leg, subsequent encounter: Secondary | ICD-10-CM

## 2019-08-12 NOTE — Progress Notes (Signed)
    Procedures performed today:    None.  Independent interpretation of notes and tests performed by another provider:   None.  Brief History, Exam, Impression, and Recommendations:    Posterolateral complex injury Russell Ramos returns, 5 years ago he sustained a posterior lateral corner injury, he had operative repair by Dr. Corinna Capra. Unfortunately went bowling recently, about 3 to 4 months ago he twisted and felt pain in his left knee. He had some swelling, he saw Dr. Ashley Royalty, MRI was ordered that showed medial meniscal extrusion, joint effusion, I also saw some T2 signal going through his lateral meniscus. I injected his knee, he returns today 2 weeks later with persistence of discomfort. Arthrocentesis was negative for any crystals. Considering suspected lateral meniscal tear, failure of injection I would like him to revisit this with one of our orthopedic surgeons, I do think he is going to need arthroscopy. He will get his MRI burned to a disc.    ___________________________________________ [name]. Benjamin Stain, M.D., ABFM., CAQSM. Primary Care and Sports Medicine [facility]  Adjunct Instructor of Family Medicine  [employer]

## 2019-08-12 NOTE — Assessment & Plan Note (Signed)
Russell Ramos returns, 5 years ago he sustained a posterior lateral corner injury, he had operative repair by Dr. Corinna Capra. Unfortunately went bowling recently, about 3 to 4 months ago he twisted and felt pain in his left knee. He had some swelling, he saw Dr. Ashley Royalty, MRI was ordered that showed medial meniscal extrusion, joint effusion, I also saw some T2 signal going through his lateral meniscus. I injected his knee, he returns today 2 weeks later with persistence of discomfort. Arthrocentesis was negative for any crystals. Considering suspected lateral meniscal tear, failure of injection I would like him to revisit this with one of our orthopedic surgeons, I do think he is going to need arthroscopy. He will get his MRI burned to a disc.

## 2019-08-26 ENCOUNTER — Ambulatory Visit: Payer: BC Managed Care – PPO | Admitting: Sports Medicine

## 2019-10-07 ENCOUNTER — Encounter: Payer: Self-pay | Admitting: Family Medicine

## 2019-10-07 ENCOUNTER — Other Ambulatory Visit: Payer: Self-pay

## 2019-10-07 ENCOUNTER — Ambulatory Visit (INDEPENDENT_AMBULATORY_CARE_PROVIDER_SITE_OTHER): Payer: BC Managed Care – PPO | Admitting: Family Medicine

## 2019-10-07 VITALS — BP 113/78 | HR 76 | Temp 98.1°F | Wt 291.0 lb

## 2019-10-07 DIAGNOSIS — Z8674 Personal history of sudden cardiac arrest: Secondary | ICD-10-CM | POA: Diagnosis not present

## 2019-10-07 DIAGNOSIS — I10 Essential (primary) hypertension: Secondary | ICD-10-CM

## 2019-10-07 DIAGNOSIS — E118 Type 2 diabetes mellitus with unspecified complications: Secondary | ICD-10-CM | POA: Diagnosis not present

## 2019-10-07 DIAGNOSIS — E1169 Type 2 diabetes mellitus with other specified complication: Secondary | ICD-10-CM

## 2019-10-07 DIAGNOSIS — S8992XD Unspecified injury of left lower leg, subsequent encounter: Secondary | ICD-10-CM

## 2019-10-07 DIAGNOSIS — E1159 Type 2 diabetes mellitus with other circulatory complications: Secondary | ICD-10-CM | POA: Diagnosis not present

## 2019-10-07 DIAGNOSIS — F410 Panic disorder [episodic paroxysmal anxiety] without agoraphobia: Secondary | ICD-10-CM

## 2019-10-07 DIAGNOSIS — E782 Mixed hyperlipidemia: Secondary | ICD-10-CM

## 2019-10-07 DIAGNOSIS — I152 Hypertension secondary to endocrine disorders: Secondary | ICD-10-CM

## 2019-10-07 LAB — LIPID PANEL
Cholesterol: 185 mg/dL (ref <200)
HDL: 43 mg/dL (ref >=40)
LDL Cholesterol (Calc): 120 mg/dL (calc) — ABNORMAL HIGH
Non-HDL Cholesterol (Calc): 142 mg/dL (calc) — ABNORMAL HIGH (ref <130)
Total CHOL/HDL Ratio: 4.3 (calc) (ref <5.0)
Triglycerides: 109 mg/dL (ref <150)

## 2019-10-07 LAB — COMPLETE METABOLIC PANEL WITH GFR
AG Ratio: 1.5 (calc) (ref 1.0–2.5)
ALT: 17 U/L (ref 9–46)
AST: 12 U/L (ref 10–40)
Albumin: 4.3 g/dL (ref 3.6–5.1)
Alkaline phosphatase (APISO): 59 U/L (ref 36–130)
BUN: 19 mg/dL (ref 7–25)
CO2: 26 mmol/L (ref 20–32)
Calcium: 9.6 mg/dL (ref 8.6–10.3)
Chloride: 105 mmol/L (ref 98–110)
Creat: 1.05 mg/dL (ref 0.60–1.35)
GFR, Est African American: 101 mL/min/{1.73_m2} (ref >=60)
GFR, Est Non African American: 87 mL/min/{1.73_m2} (ref >=60)
Globulin: 2.9 g/dL (calc) (ref 1.9–3.7)
Glucose, Bld: 146 mg/dL — ABNORMAL HIGH (ref 65–99)
Potassium: 5.2 mmol/L (ref 3.5–5.3)
Sodium: 138 mmol/L (ref 135–146)
Total Bilirubin: 0.3 mg/dL (ref 0.2–1.2)
Total Protein: 7.2 g/dL (ref 6.1–8.1)

## 2019-10-07 LAB — POCT GLYCOSYLATED HEMOGLOBIN (HGB A1C): Hemoglobin A1C: 6.5 % — AB (ref 4.0–5.6)

## 2019-10-07 MED ORDER — MELOXICAM 15 MG PO TABS: 15.0000 mg | ORAL_TABLET | Freq: Every day | ORAL | 1 refills | Status: DC

## 2019-10-07 MED ORDER — PENNSAID 2 % EX SOLN: 2.0000 | Freq: Two times a day (BID) | CUTANEOUS | 1 refills | Status: DC

## 2019-10-07 NOTE — Assessment & Plan Note (Signed)
Tolerating atorvastatin well, update lipid panel.

## 2019-10-07 NOTE — Assessment & Plan Note (Signed)
Has ICD  Continues to see cardiology.

## 2019-10-07 NOTE — Patient Instructions (Signed)
Continue current medications.  Try pennsaid to knee as needed.  Alternatively you can purchase voltaren over the counter.  Have labs completed.  See me again in about 6 months.

## 2019-10-07 NOTE — Assessment & Plan Note (Signed)
Blood pressure is at goal at for age and co-morbidities.  I recommend continuation of lisinopril at current dose.  In addition they were instructed to follow a low sodium diet with regular exercise to help to maintain adequate control of blood pressure.

## 2019-10-07 NOTE — Progress Notes (Signed)
Russell Ramos - 44 y.o. male MRN [id number]  Date of birth: 1975-10-13  Subjective No chief complaint on file.   HPI Russell Ramos is a 44 y.o. male here today for follow up visit.  He has a history of HTN, T2DM, OSA, HLD and cardiac arrest.  Today he reports he is doing "ok"  -T2DM:  Current management with trulicity and jardiance.  Blood sugars well controlled at home.  He denies episodes of hypoglycemia with current medications.  Weight is is down a couple of pounds  -HTN:  Managed with lisinopril and metoprolol.  BP has remained well controlled with current medications.  He does have history of cardiac arrest and has had ICD placed.  He does continue to see cardiology.  He denies chest pain, shortness of breath, palpitations, headache or dizziness.   -Depression and anxiety:  Managed by psychiatry.  Well controlled at this time  -Knee pain: Continues to have pain in L knee.  Had injection by Dr. Karie Schwalbe but relief was only short term.  He did see ortho as well. He does get some relief with meloxicam.  He has tried several braces without relief.    ROS:  A comprehensive ROS was completed and negative except as noted per HPI    Allergies  Allergen Reactions  . Lactose     Other reaction(s): GI Upset (intolerance)    Past Medical History:  Diagnosis Date  . Chronic left shoulder pain   . GAD (generalized anxiety disorder)   . History of DVT in adulthood    2016 left lower extremity post op knee surgery,  completed xarelto treatment,  per pt none since and all test came back negative for blood disorder  . History of panic attacks   . History of pulmonary embolus (PE)    2016  w/ left lower extremity , post op knee surgery , completed xarelto treatment  . 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 Hypertension   . 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 OSA on CPAP    02-03-2019 per pt uses every night  . PTSD (post-traumatic stress disorder)   . Pulmonary hypertension (HCC)   . Tear of left glenoid labrum 11/18/2018   MRI arthrogram August 2019  . Type 2 diabetes mellitus (HCC)    followed by pcp--- (02-03-2019 per pt checks cbg 3 to 4 times weekly in AM,  Fasting cbg-- 120-130    Past Surgical History:  Procedure Laterality Date  . ANTERIOR CRUCIATE LIGAMENT REPAIR Left 2016  approx.    AND MCL REPAIR  . ARTHROSCOPY POSTERIOR LATERAL CORNER RECONSTRUCTION WITH ALLOGRAFT,  LEFT COMMON PERONEAL NERVE NEUROLYSIS Left 07-14-2014    @[facility]   . CARDIAC CATHETERIZATION  06-23-2018   @[facility]    emergent for resusitated sudden cardiac arrest (VF) ;  showed non-obstructive CAD, ef 40-45%  . EP IMPLANTABLE DEVICE    . POSTERIOR LATERAL RECONSTRUCTION PERNEAL NERVE NEUROLYSIS  07/14/2014  . RADIOLOGY WITH ANESTHESIA Left 10/01/2017   Procedure: MRI OF LEFT SHOULDER WITH CONTRAST AND DG FLOURO GUIDE NEEDLE INJECTION;  Surgeon: Radiologist, Medication, MD;  Location: [facility] OR;  Service: Radiology;  Laterality: Left;  . SHOULDER ARTHROSCOPY WITH BICEPS TENDON REPAIR Left 02/05/2019   Procedure: SHOULDER ARTHROSCOPY WITH BICEPS TENODESIS;  Surgeon: Jones Broom, MD;  Location[facility];  Service: Orthopedics;  Laterality: Left;    Social History   Socioeconomic History  . Marital status: Married    Spouse name: Not on file  . Number of children: Not on file  . Years of education: Not on file  . Highest education level: Not on file  Occupational History  . Not on file  Tobacco Use  . Smoking status: Former Smoker    Packs/day: 1.00    Years: 25.00    Pack years: 25.00    Types: Cigarettes    Quit date: 09/12/2015    Years since quitting: 4.0  . Smokeless tobacco: Never Used  Vaping Use  . Vaping  Use: Some days  . Devices: vaperizer-- no nicotine  Substance and Sexual Activity  . Alcohol use: Yes    Comment: occasional  . Drug use: Yes    Types: Marijuana    Comment: 02-03-2019  last used today  . Sexual activity: Yes    Partners: Female  Other Topics Concern  . Not on file  Social History Narrative   Pt is has been married for 20 years. He lives at home with his wife and son.         Epworth Sleepiness Scale Score: 7      --I have been told that I stop breathing during sleep    --I have diabetes   --I have been told that I snore   --I am overweight or am gaining weight   --I awake feeling not rested   Social Determinants of Health   Financial Resource Strain:   . Difficulty of Paying Living Expenses:   Food Insecurity:   . Worried About Programme researcher, broadcasting/film/video in the Last Year:   . Barista in the Last Year:   Transportation Needs:   . Freight forwarder (Medical):   Marland Kitchen Lack of Transportation (Non-Medical):   Physical Activity:   . Days of Exercise per Week:   . Minutes of Exercise per Session:   Stress:   . Feeling of Stress :   Social Connections:   . Frequency of Communication with Friends and Family:   . Frequency of Social Gatherings with Friends and Family:   . Attends Religious Services:   . Active Member of Clubs or Organizations:   . Attends Banker Meetings:   Marland Kitchen Marital Status:     Family History  Problem Relation Age of Onset  . Diabetes Mother     Health Maintenance  Topic Date Due  . Hepatitis C Screening  Never done  . OPHTHALMOLOGY EXAM  Never done  . HEMOGLOBIN A1C  05/17/2019  . INFLUENZA VACCINE  09/20/2019  . FOOT EXAM  06/25/2020 (Originally 10/08/2018)  . TETANUS/TDAP  09/15/2025  . PNEUMOCOCCAL POLYSACCHARIDE VACCINE AGE 69-64 HIGH RISK  Completed  . COVID-19 Vaccine  Completed  . HIV Screening  Completed      ----------------------------------------------------------------------------------------------------------------------------------------------------------------------------------------------------------------- Physical Exam There were no vitals taken for this visit.  Physical Exam Constitutional:      Appearance: Normal appearance.  HENT:     Head: Normocephalic and atraumatic.  Cardiovascular:     Rate and Rhythm: Normal rate and regular rhythm.  Pulmonary:     Effort: Pulmonary effort is normal.     Breath sounds: Normal breath sounds.  Neurological:     General: No focal deficit present.     Mental Status: He is alert.  Psychiatric:        Mood and Affect: Mood normal.        Behavior: Behavior normal.     ------------------------------------------------------------------------------------------------------------------------------------------------------------------------------------------------------------------- Assessment and Plan  Hypertension associated with diabetes (HCC) Blood pressure is at goal at for age and co-morbidities.  I recommend continuation of lisinopril at current dose.  In addition they were instructed to follow a low sodium diet with regular exercise to help to maintain adequate control of blood pressure.    History of sudden cardiac arrest successfully resuscitated Has ICD  Continues to see cardiology.   Type 2 diabetes mellitus with unspecified complications (HCC) Most recent A1c of  Lab Results  Component Value Date   HGBA1C 6.5 (A) 10/07/2019   indicates diabetes is well controlled.  He will continue trulicity and jardiance.  Counseled on healthy, low carb diet and recommend frequent activity to help with maintaining good control of blood sugars.    Posterolateral complex injury Continued knee pain.  He will try pennsaid to area as well as meloxicam as needed.   Mixed diabetic hyperlipidemia associated with type 2 diabetes mellitus  (HCC) Tolerating atorvastatin well, update lipid panel.   Panic disorder Managed by psychiatry, stable at this time.    Meds ordered this encounter  Medications  . Diclofenac Sodium (PENNSAID) 2 % SOLN    Sig: Apply 2 Act topically in the morning and at bedtime.    Dispense:  112 g    Refill:  1  . meloxicam (MOBIC) 15 MG tablet    Sig: Take 1 tablet (15 mg total) by mouth daily.    Dispense:  90 tablet    Refill:  1    Return in about 6 months (around 04/08/2020) for HTN/DM.    This visit occurred during the SARS-CoV-2 public health emergency.  Safety protocols were in place, including screening questions prior to the visit, additional usage of staff PPE, and extensive cleaning of exam room while observing appropriate contact time as indicated for disinfecting solutions.

## 2019-10-07 NOTE — Assessment & Plan Note (Signed)
Continued knee pain.  He will try pennsaid to area as well as meloxicam as needed.

## 2019-10-07 NOTE — Assessment & Plan Note (Signed)
Most recent A1c of  Lab Results  Component Value Date   HGBA1C 6.5 (A) 10/07/2019   indicates diabetes is well controlled.  He will continue trulicity and jardiance.  Counseled on healthy, low carb diet and recommend frequent activity to help with maintaining good control of blood sugars.

## 2019-10-07 NOTE — Assessment & Plan Note (Signed)
Managed by psychiatry, stable at this time.

## 2020-03-11 ENCOUNTER — Other Ambulatory Visit: Payer: Self-pay | Admitting: Physician Assistant

## 2020-03-14 ENCOUNTER — Other Ambulatory Visit: Payer: Self-pay | Admitting: Family Medicine

## 2020-03-15 ENCOUNTER — Other Ambulatory Visit: Payer: Self-pay

## 2020-03-15 MED ORDER — MELOXICAM 15 MG PO TABS: 15.0000 mg | ORAL_TABLET | Freq: Every day | ORAL | 1 refills | Status: DC

## 2020-04-08 ENCOUNTER — Ambulatory Visit: Payer: BC Managed Care – PPO | Admitting: Family Medicine

## 2020-04-27 ENCOUNTER — Other Ambulatory Visit: Payer: Self-pay | Admitting: Family Medicine

## 2020-07-05 ENCOUNTER — Other Ambulatory Visit: Payer: Self-pay | Admitting: Family Medicine

## 2020-09-04 ENCOUNTER — Other Ambulatory Visit: Payer: Self-pay | Admitting: Family Medicine

## 2020-09-04 DIAGNOSIS — I10 Essential (primary) hypertension: Secondary | ICD-10-CM

## 2020-09-04 DIAGNOSIS — E1169 Type 2 diabetes mellitus with other specified complication: Secondary | ICD-10-CM

## 2020-09-04 DIAGNOSIS — E1165 Type 2 diabetes mellitus with hyperglycemia: Secondary | ICD-10-CM

## 2020-09-04 DIAGNOSIS — R5382 Chronic fatigue, unspecified: Secondary | ICD-10-CM

## 2020-09-04 DIAGNOSIS — E782 Mixed hyperlipidemia: Secondary | ICD-10-CM

## 2020-09-04 DIAGNOSIS — R748 Abnormal levels of other serum enzymes: Secondary | ICD-10-CM

## 2020-09-06 NOTE — Telephone Encounter (Signed)
Please schedule patient for yearly appt. Not seen since 09/2019.  Sending 30 day refill

## 2020-09-06 NOTE — Telephone Encounter (Signed)
Left voicemail message for patient to call back to get appt scheduled. AM °

## 2020-09-09 ENCOUNTER — Encounter: Payer: Self-pay | Admitting: Family Medicine

## 2020-09-09 MED ORDER — TRULICITY 1.5 MG/0.5ML ~~LOC~~ SOAJ: 1.5000 mg | SUBCUTANEOUS | 1 refills | Status: DC

## 2020-10-04 ENCOUNTER — Other Ambulatory Visit: Payer: Self-pay | Admitting: Family Medicine

## 2020-10-04 DIAGNOSIS — I10 Essential (primary) hypertension: Secondary | ICD-10-CM

## 2020-10-04 DIAGNOSIS — E782 Mixed hyperlipidemia: Secondary | ICD-10-CM

## 2020-10-04 DIAGNOSIS — E1165 Type 2 diabetes mellitus with hyperglycemia: Secondary | ICD-10-CM

## 2020-10-04 DIAGNOSIS — R5382 Chronic fatigue, unspecified: Secondary | ICD-10-CM

## 2020-10-04 DIAGNOSIS — E1169 Type 2 diabetes mellitus with other specified complication: Secondary | ICD-10-CM

## 2020-10-04 DIAGNOSIS — R748 Abnormal levels of other serum enzymes: Secondary | ICD-10-CM

## 2020-10-04 NOTE — Telephone Encounter (Signed)
Pt made an appt for 10/17/2020 @ 11:30 and he also wanted to discuss using another medication- tvt

## 2020-10-04 NOTE — Telephone Encounter (Signed)
Please contact patient to schedule appt. Sending refills to pharmacy to hold until appt.

## 2020-10-17 ENCOUNTER — Ambulatory Visit: Payer: BC Managed Care – PPO | Admitting: Family Medicine

## 2020-10-20 ENCOUNTER — Other Ambulatory Visit: Payer: Self-pay

## 2020-10-20 ENCOUNTER — Ambulatory Visit: Payer: BC Managed Care – PPO | Admitting: Family Medicine

## 2020-10-27 ENCOUNTER — Ambulatory Visit (INDEPENDENT_AMBULATORY_CARE_PROVIDER_SITE_OTHER): Payer: BC Managed Care – PPO | Admitting: Family Medicine

## 2020-10-27 ENCOUNTER — Other Ambulatory Visit: Payer: Self-pay

## 2020-10-27 ENCOUNTER — Encounter: Payer: Self-pay | Admitting: Family Medicine

## 2020-10-27 ENCOUNTER — Ambulatory Visit (INDEPENDENT_AMBULATORY_CARE_PROVIDER_SITE_OTHER): Payer: BC Managed Care – PPO

## 2020-10-27 VITALS — BP 117/74 | HR 94 | Wt 318.0 lb

## 2020-10-27 DIAGNOSIS — M25511 Pain in right shoulder: Secondary | ICD-10-CM

## 2020-10-27 DIAGNOSIS — R748 Abnormal levels of other serum enzymes: Secondary | ICD-10-CM

## 2020-10-27 DIAGNOSIS — E782 Mixed hyperlipidemia: Secondary | ICD-10-CM

## 2020-10-27 DIAGNOSIS — G8929 Other chronic pain: Secondary | ICD-10-CM | POA: Insufficient documentation

## 2020-10-27 DIAGNOSIS — E1165 Type 2 diabetes mellitus with hyperglycemia: Secondary | ICD-10-CM | POA: Diagnosis not present

## 2020-10-27 DIAGNOSIS — I152 Hypertension secondary to endocrine disorders: Secondary | ICD-10-CM

## 2020-10-27 DIAGNOSIS — S8992XD Unspecified injury of left lower leg, subsequent encounter: Secondary | ICD-10-CM

## 2020-10-27 DIAGNOSIS — Z23 Encounter for immunization: Secondary | ICD-10-CM

## 2020-10-27 DIAGNOSIS — Z8674 Personal history of sudden cardiac arrest: Secondary | ICD-10-CM

## 2020-10-27 DIAGNOSIS — E118 Type 2 diabetes mellitus with unspecified complications: Secondary | ICD-10-CM | POA: Diagnosis not present

## 2020-10-27 DIAGNOSIS — I10 Essential (primary) hypertension: Secondary | ICD-10-CM

## 2020-10-27 DIAGNOSIS — R5382 Chronic fatigue, unspecified: Secondary | ICD-10-CM

## 2020-10-27 DIAGNOSIS — E1159 Type 2 diabetes mellitus with other circulatory complications: Secondary | ICD-10-CM | POA: Diagnosis not present

## 2020-10-27 DIAGNOSIS — E1169 Type 2 diabetes mellitus with other specified complication: Secondary | ICD-10-CM

## 2020-10-27 LAB — POCT GLYCOSYLATED HEMOGLOBIN (HGB A1C): HbA1c, POC (controlled diabetic range): 7.3 % — AB (ref 0.0–7.0)

## 2020-10-27 MED ORDER — TIRZEPATIDE 2.5 MG/0.5ML ~~LOC~~ SOAJ: 2.5000 mg | SUBCUTANEOUS | 0 refills | Status: AC

## 2020-10-27 MED ORDER — ATORVASTATIN CALCIUM 80 MG PO TABS: 80.0000 mg | ORAL_TABLET | Freq: Every day | ORAL | 1 refills | Status: AC

## 2020-10-27 MED ORDER — TIRZEPATIDE 5 MG/0.5ML ~~LOC~~ SOAJ: 5.0000 mg | SUBCUTANEOUS | 1 refills | Status: AC

## 2020-10-27 MED ORDER — LISINOPRIL 5 MG PO TABS: 5.0000 mg | ORAL_TABLET | Freq: Every day | ORAL | 2 refills | Status: AC

## 2020-10-27 MED ORDER — EMPAGLIFLOZIN 25 MG PO TABS: 25.0000 mg | ORAL_TABLET | Freq: Every day | ORAL | 2 refills | Status: AC

## 2020-10-27 MED ORDER — MELOXICAM 15 MG PO TABS: 15.0000 mg | ORAL_TABLET | Freq: Every day | ORAL | 0 refills | Status: AC

## 2020-10-27 NOTE — Assessment & Plan Note (Signed)
He is seeing orthopedics.  Considering having arthroscopy.

## 2020-10-27 NOTE — Assessment & Plan Note (Signed)
Blood pressure is well controlled at this time.  He will continue metoprolol and lisinopril for management of hypertension.

## 2020-10-27 NOTE — Progress Notes (Signed)
Russell Ramos - 45 y.o. male MRN [id number]  Date of birth: 03-Apr-1975  Subjective Chief Complaint  Patient presents with   Diabetes    HPI [name] is a 45 year old male here today for follow-up visit.  He has history of diabetes is currently managed with Trulicity and Jardiance.  He did notice some appetite suppression when he first started this however this has not been as noticeable over the past few months.  His A1c is higher today at 7.3%.  Psychiatric medications have contributed some to his weight gain and changes in his blood glucose as well.  He has been trying to work on weight loss.  Hypertension is treated with metoprolol 25 mg daily, lisinopril 5 mg daily.  He is also taking clonidine for behavioral health purposes.  Doing well with current blood pressure medications.  He does have history of cardiac arrest and has implantable ICD.  He does see cardiology for follow-up of this.  He denies chest pain, shortness of breath, palpitations, headache or vision changes.  Continues to tolerate atorvastatin well for hyperlipidemia.  He continues to have left knee pain.  He is seeing Dr. Benjamin Stain for this previously and is considering having surgery on this.  He is having right shoulder pain as well.  This is intermittent but occurs several times per day.  This started a few months ago as he was lifting himself from his bathtub he noted a pop with pain in his shoulder.  He does have decreased range of motion when pain is flared.  There is no radicular symptoms at this time.  ROS:  A comprehensive ROS was completed and negative except as noted per HPI  Allergies  Allergen Reactions   Lactose     Other reaction(s): GI Upset (intolerance)    Past Medical History:  Diagnosis Date   Chronic left shoulder pain    GAD (generalized anxiety disorder)    History of DVT in adulthood    2016 left lower extremity post op knee surgery,  completed xarelto treatment,  per pt none since and  all test came back negative for blood disorder   History of panic attacks    History of pulmonary embolus (PE)    2016  w/ left lower extremity , post op knee surgery , completed xarelto treatment   History of sudden cardiac arrest successfully resuscitated 06/23/2018   06-23-2018 witnesed cardiac arrest VF--- s/p  Cardiac cath 06-23-2018 ef 40-45%, non-obstructive CAD and ICD implant 06-25-2018   Hypertension    ICD (implantable cardioverter-defibrillator) in place 06-25-2018   @[facility]    followed by cardiology [facility] in [place],  dr   OSA on CPAP    02-03-2019 per pt uses every night   PTSD (post-traumatic stress disorder)    Pulmonary hypertension (HCC)    Tear of left glenoid labrum 11/18/2018   MRI arthrogram August 2019   Type 2 diabetes mellitus (HCC)    followed by pcp--- (02-03-2019 per pt checks cbg 3 to 4 times weekly in AM,  Fasting cbg-- 120-130    Past Surgical History:  Procedure Laterality Date   ANTERIOR CRUCIATE LIGAMENT REPAIR Left 2016  approx.    AND MCL REPAIR   ARTHROSCOPY POSTERIOR LATERAL CORNER RECONSTRUCTION WITH ALLOGRAFT,  LEFT COMMON PERONEAL NERVE NEUROLYSIS Left 07-14-2014    @[facility]    CARDIAC CATHETERIZATION  06-23-2018   @[facility]    emergent for resusitated sudden cardiac arrest (VF) ;  showed non-obstructive CAD, ef 40-45%   EP IMPLANTABLE DEVICE  POSTERIOR LATERAL RECONSTRUCTION PERNEAL NERVE NEUROLYSIS  07/14/2014   RADIOLOGY WITH ANESTHESIA Left 10/01/2017   Procedure: MRI OF LEFT SHOULDER WITH CONTRAST AND DG FLOURO GUIDE NEEDLE INJECTION;  Surgeon: Radiologist, Medication, MD;  Location: [facility] OR;  Service: Radiology;  Laterality: Left;   SHOULDER ARTHROSCOPY WITH BICEPS TENDON REPAIR Left 02/05/2019   Procedure: SHOULDER ARTHROSCOPY WITH BICEPS TENODESIS;  Surgeon: Jones Broom, MD;  Location: [facility];  Service: Orthopedics;  Laterality: Left;    Social History   Socioeconomic History   Marital status: Married     Spouse name: Not on file   Number of children: Not on file   Years of education: Not on file   Highest education level: Not on file  Occupational History   Not on file  Tobacco Use   Smoking status: Former    Packs/day: 1.00    Years: 25.00    Pack years: 25.00    Types: Cigarettes    Quit date: 09/12/2015    Years since quitting: 5.1   Smokeless tobacco: Never  Vaping Use   Vaping Use: Some days   Devices: vaperizer-- no nicotine  Substance and Sexual Activity   Alcohol use: Yes    Comment: occasional   Drug use: Yes    Types: Marijuana    Comment: 02-03-2019  last used today   Sexual activity: Yes    Partners: Female  Other Topics Concern   Not on file  Social History Narrative   Pt is has been married for 20 years. He lives at home with his wife and son.         Epworth Sleepiness Scale Score: 7      --I have been told that I stop breathing during sleep    --I have diabetes   --I have been told that I snore   --I am overweight or am gaining weight   --I awake feeling not rested   Social Determinants of Health   Financial Resource Strain: Not on file  Food Insecurity: Not on file  Transportation Needs: Not on file  Physical Activity: Not on file  Stress: Not on file  Social Connections: Not on file    Family History  Problem Relation Age of Onset   Diabetes Mother     Health Maintenance  Topic Date Due   OPHTHALMOLOGY EXAM  Never done   Hepatitis C Screening  Never done   FOOT EXAM  10/08/2018   COVID-19 Vaccine (2 - Booster for Janssen series) 06/28/2019   INFLUENZA VACCINE  09/19/2020   HEMOGLOBIN A1C  04/26/2021   TETANUS/TDAP  09/15/2025   PNEUMOCOCCAL POLYSACCHARIDE VACCINE AGE 62-64 HIGH RISK  Completed   HIV Screening  Completed   Pneumococcal Vaccine 61-14 Years old  Aged Out   HPV VACCINES  Aged Out      ----------------------------------------------------------------------------------------------------------------------------------------------------------------------------------------------------------------- Physical Exam BP 117/74   Pulse 94   Wt (!) 318 lb (144.2 kg)   SpO2 93% Comment: on RA  BMI 46.04 kg/m   Physical Exam Constitutional:      Appearance: Normal appearance.  HENT:     Head: Normocephalic and atraumatic.  Eyes:     General: No scleral icterus. Cardiovascular:     Rate and Rhythm: Normal rate and regular rhythm.  Pulmonary:     Effort: Pulmonary effort is normal.     Breath sounds: Normal breath sounds.  Musculoskeletal:     Cervical back: Neck supple.     Comments: Slightly decreased range  of motion of the right shoulder with no significant strength deficits.  He does have pain with resisted abduction and mild pain with internal rotation of the shoulder.  Rotator cuff strength is fairly good.  Neurological:     General: No focal deficit present.     Mental Status: He is alert.  Psychiatric:        Mood and Affect: Mood normal.        Behavior: Behavior normal.    ------------------------------------------------------------------------------------------------------------------------------------------------------------------------------------------------------------------- Assessment and Plan  Hypertension associated with diabetes (HCC) Blood pressure is well controlled at this time.  He will continue metoprolol and lisinopril for management of hypertension.  History of sudden cardiac arrest successfully resuscitated He has implantable ICD.  He will continue to see cardiology annually as well.  Type 2 diabetes mellitus with unspecified complications (HCC) A1c is a little higher today.  We discussed additional options for management of his glucose.  Encouraged continued exercise.  Order to try to see if Greggory Keen is covered.  Alternatively we can  consider Ozempic or Rybelsus.  Mixed diabetic hyperlipidemia associated with type 2 diabetes mellitus (HCC) He is tolerating atorvastatin well.  Continue at current strength.  Update labs today.  Posterolateral complex injury He is seeing orthopedics.  Considering having arthroscopy.  Chronic right shoulder pain Initial x-rays ordered today however will probably need MRI for diagnostic purposes if x-ray is unremarkable.   Meds ordered this encounter  Medications   atorvastatin (LIPITOR) 80 MG tablet    Sig: Take 1 tablet (80 mg total) by mouth daily for 15 days.    Dispense:  90 tablet    Refill:  1   empagliflozin (JARDIANCE) 25 MG TABS tablet    Sig: Take 1 tablet (25 mg total) by mouth daily.    Dispense:  90 tablet    Refill:  2   lisinopril (ZESTRIL) 5 MG tablet    Sig: Take 1 tablet (5 mg total) by mouth daily.    Dispense:  90 tablet    Refill:  2   meloxicam (MOBIC) 15 MG tablet    Sig: Take 1 tablet (15 mg total) by mouth daily.    Dispense:  30 tablet    Refill:  0   tirzepatide (MOUNJARO) 2.5 MG/0.5ML Pen    Sig: Inject 2.5 mg into the skin once a week. Use for 4 weeks    Dispense:  2 mL    Refill:  0   tirzepatide (MOUNJARO) 5 MG/0.5ML Pen    Sig: Inject 5 mg into the skin once a week. Start after completion of 2.5 mg dose    Dispense:  6 mL    Refill:  1    No follow-ups on file.    This visit occurred during the SARS-CoV-2 public health emergency.  Safety protocols were in place, including screening questions prior to the visit, additional usage of staff PPE, and extensive cleaning of exam room while observing appropriate contact time as indicated for disinfecting solutions.

## 2020-10-27 NOTE — Assessment & Plan Note (Signed)
Initial x-rays ordered today however will probably need MRI for diagnostic purposes if x-ray is unremarkable.

## 2020-10-27 NOTE — Assessment & Plan Note (Signed)
He has implantable ICD.  He will continue to see cardiology annually as well.

## 2020-10-27 NOTE — Assessment & Plan Note (Signed)
He is tolerating atorvastatin well.  Continue at current strength.  Update labs today.

## 2020-10-27 NOTE — Assessment & Plan Note (Signed)
A1c is a little higher today.  We discussed additional options for management of his glucose.  Encouraged continued exercise.  Order to try to see if Greggory Keen is covered.  Alternatively we can consider Ozempic or Rybelsus.

## 2020-10-30 ENCOUNTER — Other Ambulatory Visit: Payer: Self-pay | Admitting: Family Medicine

## 2020-10-31 ENCOUNTER — Other Ambulatory Visit: Payer: Self-pay | Admitting: Family Medicine

## 2020-10-31 DIAGNOSIS — G8929 Other chronic pain: Secondary | ICD-10-CM

## 2020-10-31 DIAGNOSIS — M25511 Pain in right shoulder: Secondary | ICD-10-CM

## 2020-11-03 ENCOUNTER — Encounter: Payer: Self-pay | Admitting: Family Medicine

## 2020-11-03 ENCOUNTER — Telehealth: Payer: Self-pay | Admitting: *Deleted

## 2020-11-03 NOTE — Telephone Encounter (Signed)
Russell Ramos from centralized scheduling called this afternoon to let us know that the pt was extremely rude and mean to 3 different staff members in that office.  He was yelling and cussing, calling the male staff members "cunts", "bitches", and told them that they "need to go suck a dick".  He stated that the schedulers are "as shitty as my doctor's office".  He canceled his MRI and will not be having it done. Dr. Ashley Royalty and Janne Lab, practice administrator aware.

## 2020-11-03 NOTE — Telephone Encounter (Signed)
He had similar interaction with the front office after missing his appointment last time.  This will not be tolerated and will proceed with dismissal.

## 2020-11-07 NOTE — Telephone Encounter (Signed)
Patient called in and stated over the phone that he wanted to tell off Dr.Matthews and panya that he wanted to come up here and do this. Stated that we never did anything for him in this office from the beginning. Wanted to get his medical records from [place] to take it to a new practice to get help. Used foul language and was very rude. Placed patient on hold to ask about records and he then spoke to front desk admin, [place]. Victorino Dike told him he would have to come in and sign a medical release to get his records and he said he was on his way.

## 2020-12-08 ENCOUNTER — Ambulatory Visit (HOSPITAL_COMMUNITY): Payer: BC Managed Care – PPO

## 2020-12-10 ENCOUNTER — Other Ambulatory Visit: Payer: Self-pay | Admitting: Family Medicine

## 2020-12-13 ENCOUNTER — Other Ambulatory Visit (HOSPITAL_COMMUNITY): Payer: BC Managed Care – PPO

## 2021-04-04 ENCOUNTER — Other Ambulatory Visit: Payer: Self-pay | Admitting: Podiatry

## 2021-04-16 ENCOUNTER — Other Ambulatory Visit: Payer: Self-pay | Admitting: Podiatry

## 2021-05-16 ENCOUNTER — Other Ambulatory Visit: Payer: Self-pay | Admitting: Family Medicine

## 2021-09-18 ENCOUNTER — Other Ambulatory Visit: Payer: Self-pay | Admitting: Podiatry

## 2021-10-30 ENCOUNTER — Other Ambulatory Visit: Payer: Self-pay | Admitting: Family Medicine

## 2021-10-30 DIAGNOSIS — E1165 Type 2 diabetes mellitus with hyperglycemia: Secondary | ICD-10-CM

## 2021-11-05 ENCOUNTER — Other Ambulatory Visit: Payer: Self-pay | Admitting: Family Medicine

## 2021-11-05 DIAGNOSIS — I10 Essential (primary) hypertension: Secondary | ICD-10-CM

## 2021-11-05 DIAGNOSIS — R5382 Chronic fatigue, unspecified: Secondary | ICD-10-CM

## 2021-11-05 DIAGNOSIS — E1169 Type 2 diabetes mellitus with other specified complication: Secondary | ICD-10-CM

## 2021-11-05 DIAGNOSIS — E1165 Type 2 diabetes mellitus with hyperglycemia: Secondary | ICD-10-CM

## 2021-11-05 DIAGNOSIS — R748 Abnormal levels of other serum enzymes: Secondary | ICD-10-CM

## 2021-11-25 ENCOUNTER — Other Ambulatory Visit: Payer: Self-pay | Admitting: Family Medicine

## 2021-11-25 DIAGNOSIS — R5382 Chronic fatigue, unspecified: Secondary | ICD-10-CM

## 2021-11-25 DIAGNOSIS — R748 Abnormal levels of other serum enzymes: Secondary | ICD-10-CM

## 2021-11-25 DIAGNOSIS — E1169 Type 2 diabetes mellitus with other specified complication: Secondary | ICD-10-CM

## 2021-11-25 DIAGNOSIS — I10 Essential (primary) hypertension: Secondary | ICD-10-CM

## 2021-11-25 DIAGNOSIS — E1165 Type 2 diabetes mellitus with hyperglycemia: Secondary | ICD-10-CM

## 2021-12-22 IMAGING — MR MR KNEE*L* W/O CM
7 series · 40 of 40 positions shown · non-contrast
Comparison: Left knee MRI 05/28/2016.

CLINICAL DATA: Chronic knee pain and instability.

EXAM:
MRI OF THE LEFT KNEE WITHOUT CONTRAST
TECHNIQUE: Multiplanar, multisequence MR imaging of the knee was performed. No
intravenous contrast was administered.

[Series 16: T2 fat-sat · axial · left · 4.0mm · 0.36mm/px · z∈[-184,-59]mm · 5 of 26 slices shown (1 of 3)]
[im 1/26]
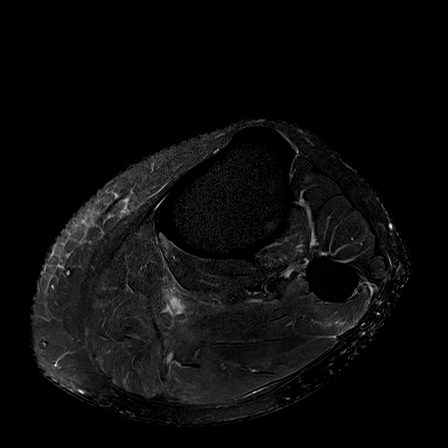
[im 7/26]
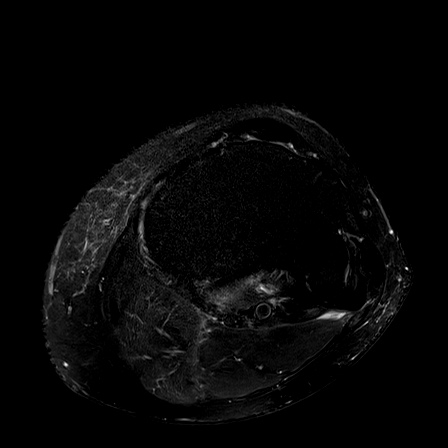
[im 13/26]
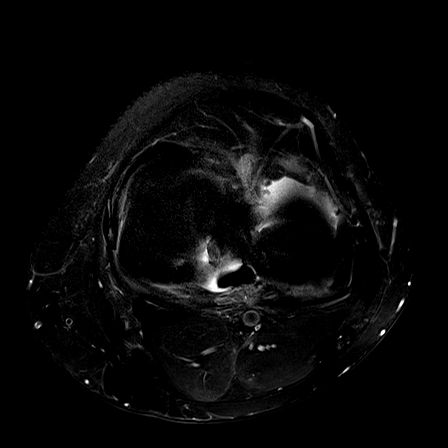
[im 19/26]
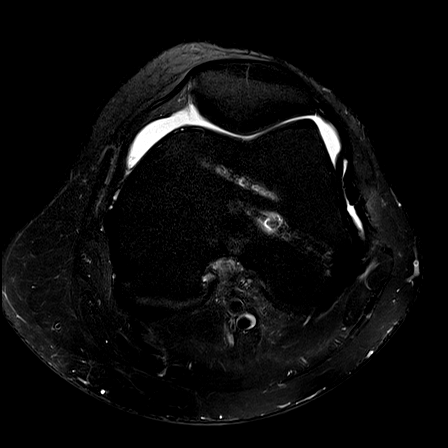
[im 26/26]
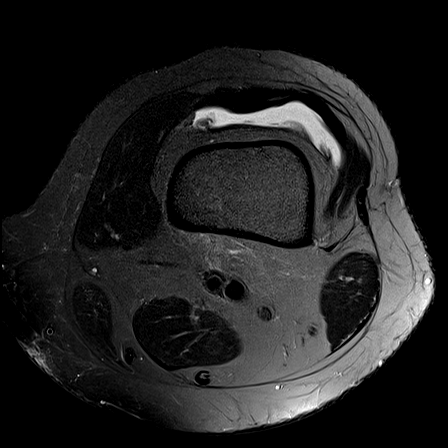

[Series 17: PD fat-sat · coronal · left · 3.0mm · 0.47mm/px · 6 of 34 slices shown (1 of 2)]
[im 1/34]
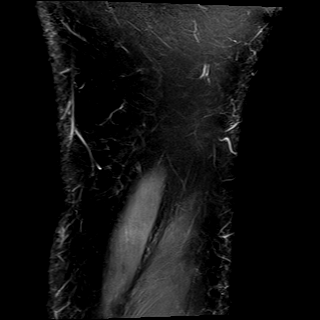
[im 7/34]
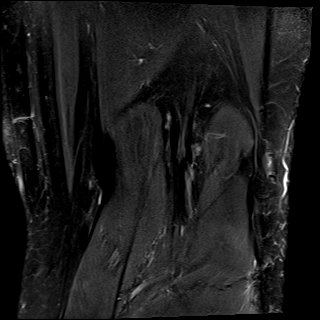
[im 14/34]
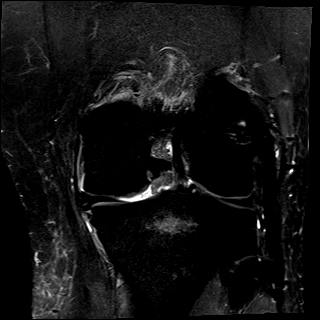
[im 20/34]
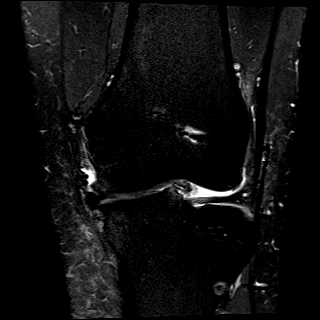
[im 27/34]
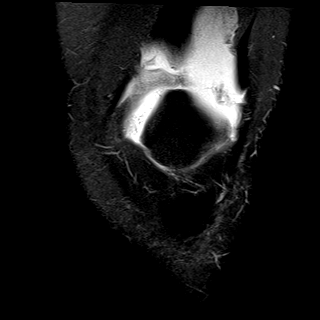
[im 34/34]
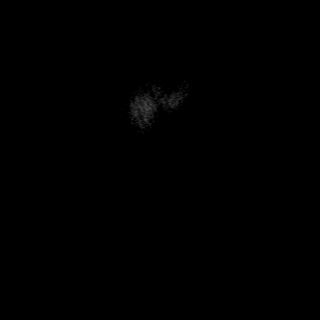

[Series 18: T1 · coronal · left · 3.0mm · 0.59mm/px · 6 of 34 slices shown]
[im 1/34]
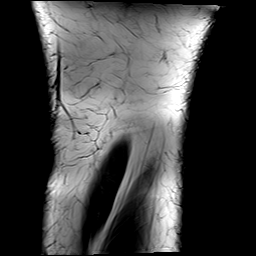
[im 7/34]
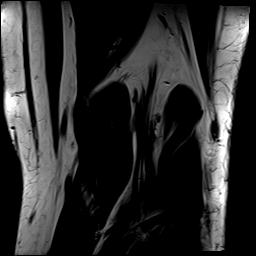
[im 14/34]
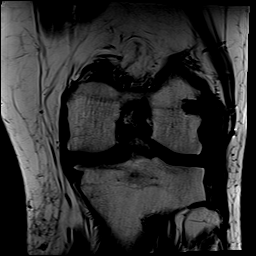
[im 20/34]
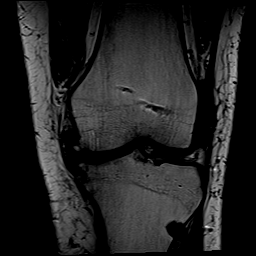
[im 27/34]
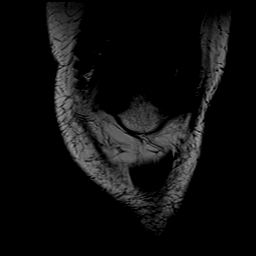
[im 34/34]
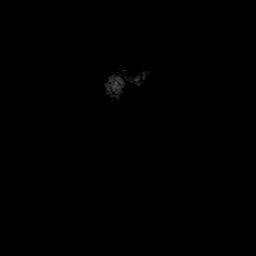

[Series 19: T2 fat-sat · coronal · left · 3.0mm · 0.59mm/px · 6 of 34 slices shown (2 of 3)]
[im 1/34]
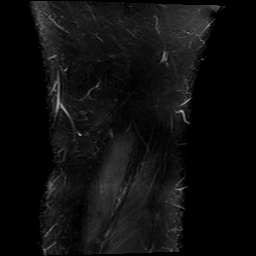
[im 7/34]
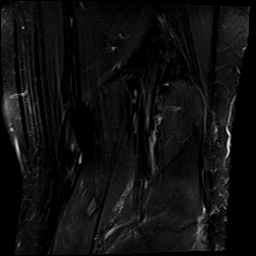
[im 14/34]
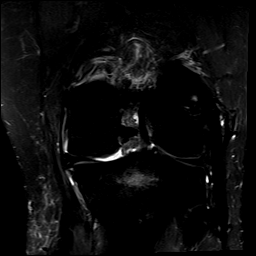
[im 20/34]
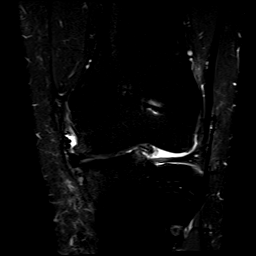
[im 27/34]
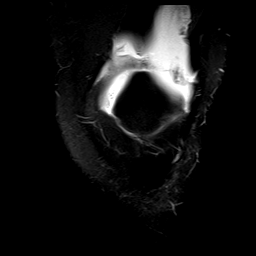
[im 34/34]
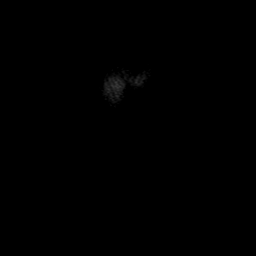

[Series 20: PD fat-sat · sagittal · left · 3.0mm · 0.52mm/px · 7 of 36 slices shown (2 of 2)]
[im 1/36]
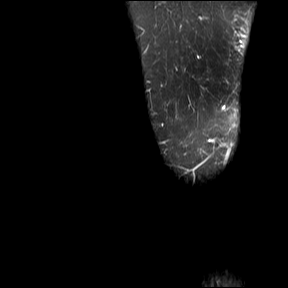
[im 6/36]
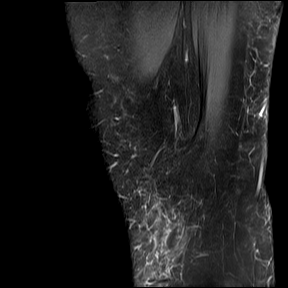
[im 12/36]
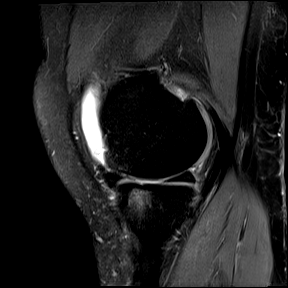
[im 18/36]
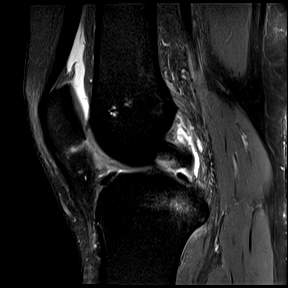
[im 24/36]
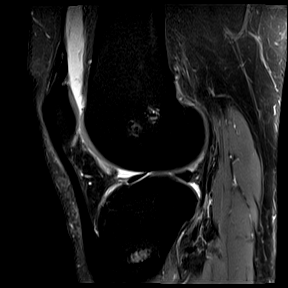
[im 30/36]
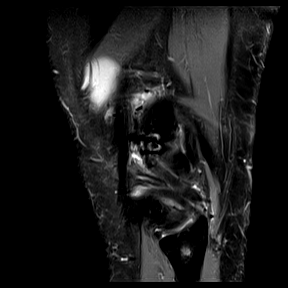
[im 36/36]
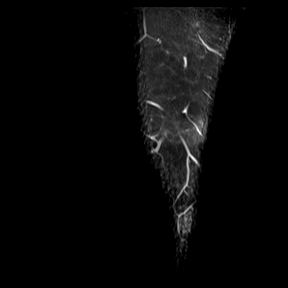

[Series 21: T2 fat-sat · sagittal · left · 3.0mm · 0.52mm/px · 7 of 36 slices shown (3 of 3)]
[im 1/36]
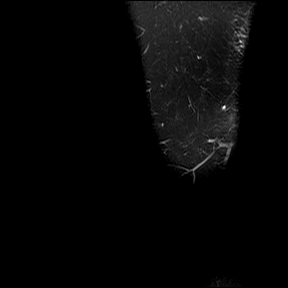
[im 6/36]
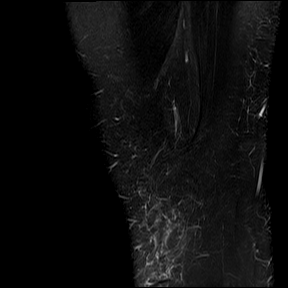
[im 12/36]
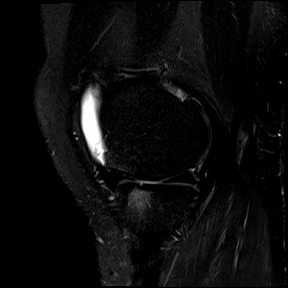
[im 18/36]
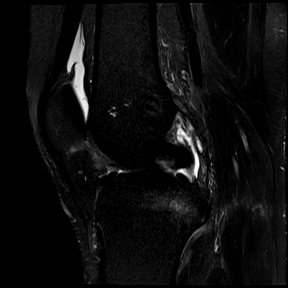
[im 24/36]
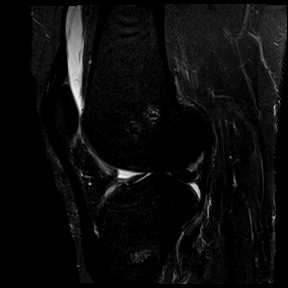
[im 30/36]
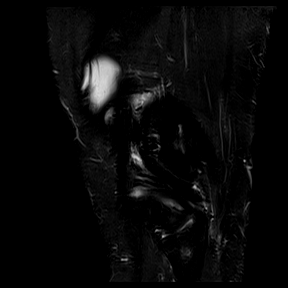
[im 36/36]
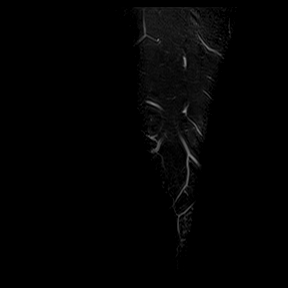

[Series 22: PD · coronal · left · 2.0mm · 0.47mm/px · 3 of 16 slices shown]
[im 1/16]
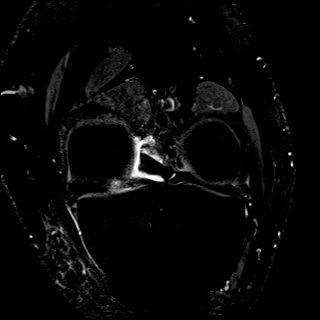
[im 8/16]
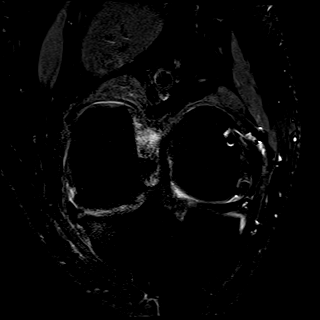
[im 16/16]
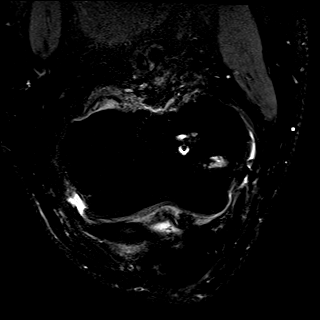

[40 of 40 positions shown; findings below may reference images not displayed]

FINDINGS: MENISCI

Medial meniscus: Mildly progressive peripheral extrusion from the
joint with free edge fraying. The meniscal root is intact, and no
discrete meniscal tear identified.

Lateral meniscus:  Intact with normal morphology.

LIGAMENTS

Cruciates:  Intact.

Collaterals: Intact medial collateral ligament. There are
postsurgical changes laterally consistent with previous fibular
collateral ligament repair. The repaired tendon is chronically
thickened, but intact.

CARTILAGE

Patellofemoral:  Preserved.

Medial: Mildly progressive chondral thinning and surface
irregularity with new reactive edema peripherally in the medial
tibial plateau. No discrete subchondral insufficiency fracture
identified.

Lateral:  Preserved.

MISCELLANEOUS

Joint:  Small joint effusion, improved from previous study.

Popliteal Fossa:  Unremarkable. No significant Baker's cyst.

Extensor Mechanism:  Intact.

Bones: In addition to the probable reactive edema peripherally in
the medial tibial plateau, there is increased edema posteriorly in
the central tibia near the PCL insertion. Anchor screws in the
lateral femoral condyle, fibular head and lateral tibial metaphysis
appear unchanged. No acute osseous findings.

Other: Stable postsurgical changes related to remote lateral
release.
IMPRESSION: 1. Mildly progressive peripheral extrusion of the medial meniscus
from the joint with new reactive edema peripherally in the medial
tibial plateau. No discrete meniscal tear identified.
2. The lateral meniscus, cruciate and collateral ligaments are
intact post repair of the fibular collateral ligament.
3. Small joint effusion, improved from previous study.

## 2021-12-22 IMAGING — CR DG CHEST 1V
1 series · 1 of 1 positions shown · non-contrast
Comparison: September 14, 2015

CLINICAL DATA: Question pacemaker status

EXAM:
CHEST  1 VIEW

[chest pa]
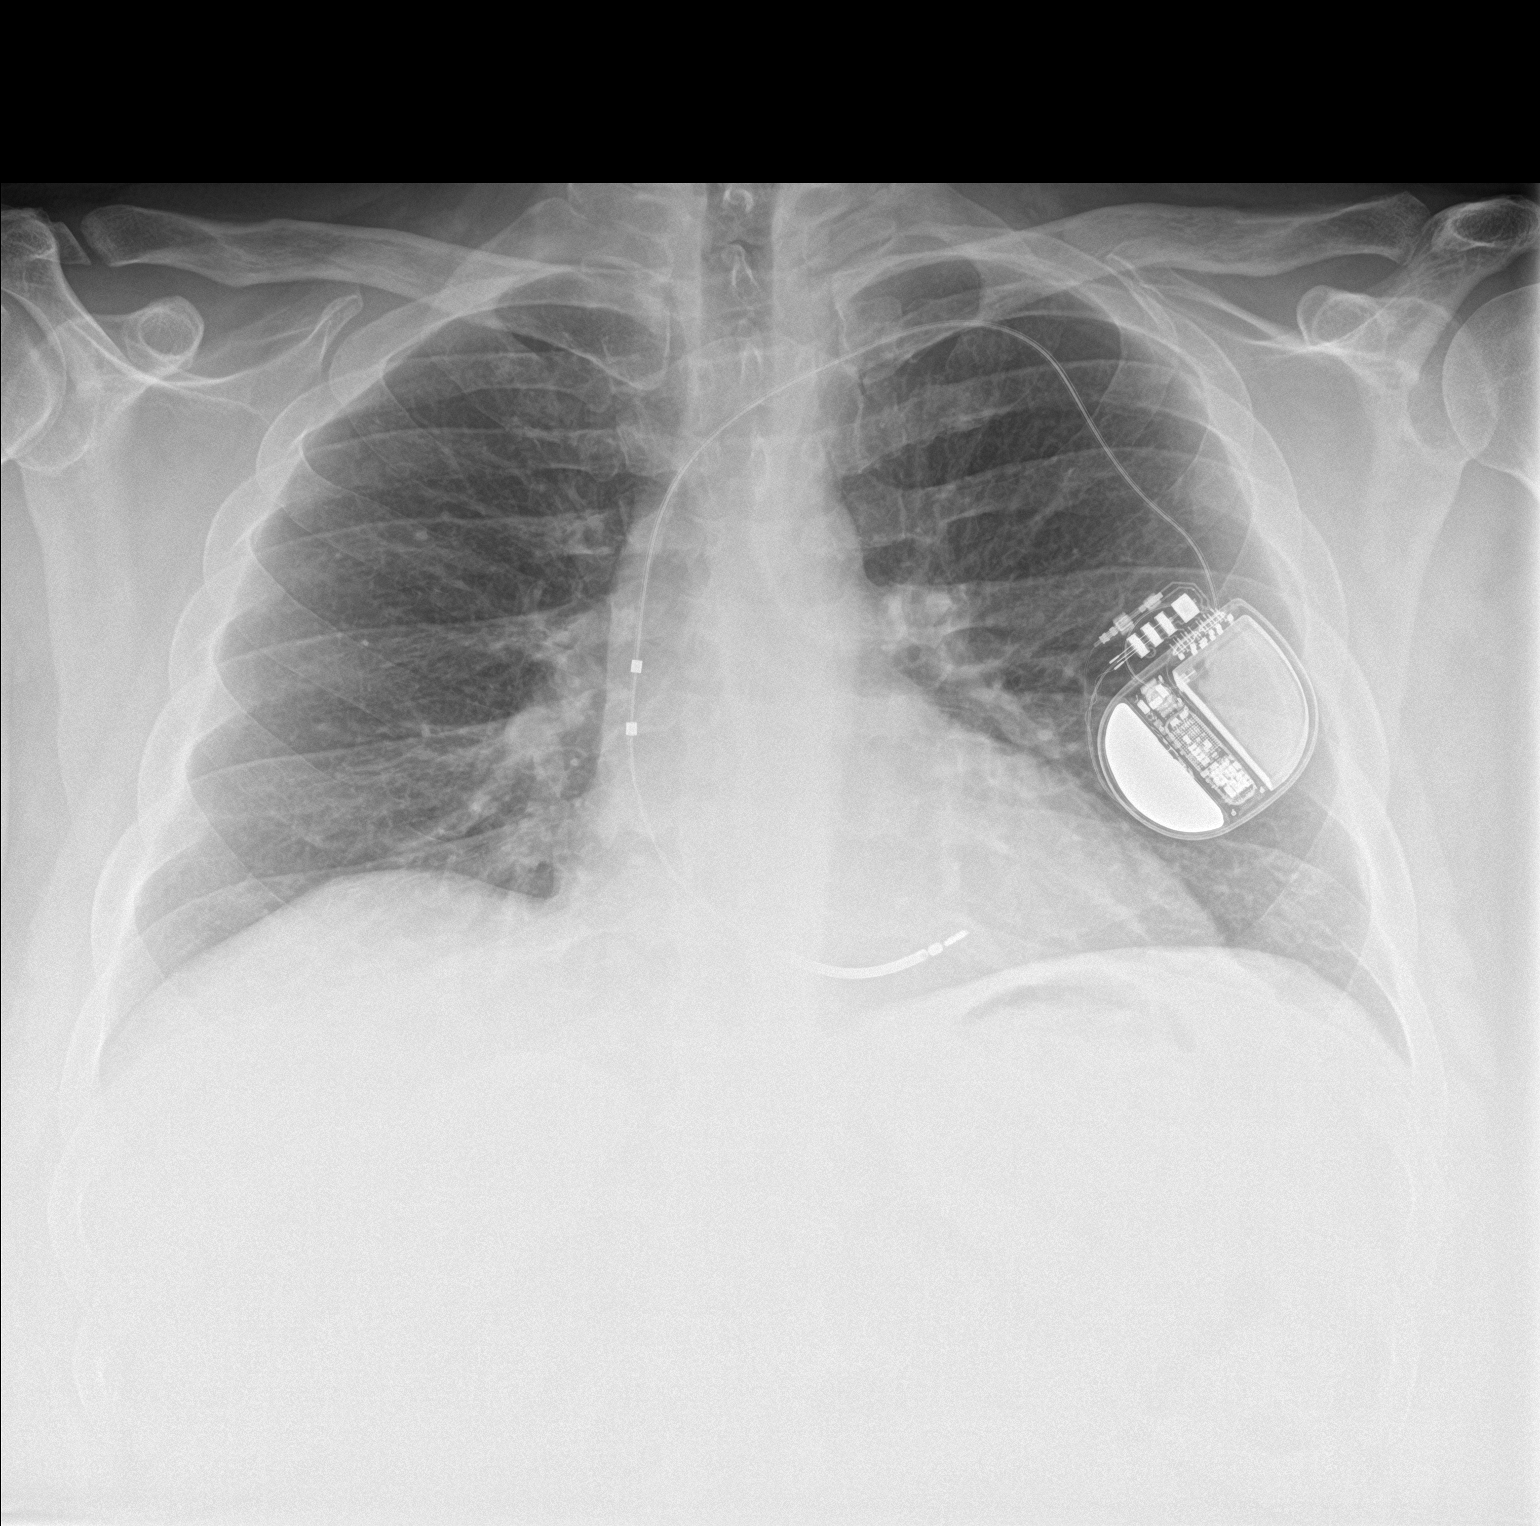

[1 of 1 positions shown; findings below may reference images not displayed]

FINDINGS: There is a pacemaker present with lead attached to right ventricle.
Heart size normal. There is a small granuloma in the right upper
lobe. Lungs elsewhere clear. No adenopathy. No bone lesions
IMPRESSION: Pacemaker present with lead attached to right ventricle. Heart size
normal. Evidence of granulomatous disease. No edema or airspace
opacity.
